# Patient Record
Sex: Male | Born: 1995 | Race: Black or African American | Hispanic: No | Marital: Single | Smoking: Former smoker
Health system: Southern US, Community
[De-identification: ages and names within clinical notes are randomized; demographics above are authoritative.]

## PROBLEM LIST (undated history)

## (undated) DIAGNOSIS — T7840XA Allergy, unspecified, initial encounter: Secondary | ICD-10-CM

## (undated) DIAGNOSIS — E669 Obesity, unspecified: Secondary | ICD-10-CM

## (undated) HISTORY — DX: Obesity, unspecified: E66.9

## (undated) HISTORY — DX: Allergy, unspecified, initial encounter: T78.40XA

## (undated) HISTORY — PX: WISDOM TOOTH EXTRACTION: SHX21

---
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
## 2016-02-08 ENCOUNTER — Ambulatory Visit: Payer: Medicaid Other | Admitting: Family Medicine

## 2016-09-25 ENCOUNTER — Emergency Department (HOSPITAL_COMMUNITY)
Admission: EM | Admit: 2016-09-25 | Discharge: 2016-09-25 | Disposition: A | Discharge status: Home / Self Care | Payer: Medicaid Other | Attending: Emergency Medicine | Admitting: Emergency Medicine

## 2016-09-25 ENCOUNTER — Encounter (HOSPITAL_COMMUNITY): Payer: Self-pay | Admitting: *Deleted

## 2016-09-25 DIAGNOSIS — L02212 Cutaneous abscess of back [any part, except buttock]: Secondary | ICD-10-CM | POA: Insufficient documentation

## 2016-09-25 DIAGNOSIS — Z79899 Other long term (current) drug therapy: Secondary | ICD-10-CM | POA: Insufficient documentation

## 2016-09-25 NOTE — Discharge Instructions (Signed)
You can take Aleve for pain Keep wound clean with warm soap and water and keep bandage dry, do not submerge in water for 24 hours. Change bandage sooner if it gets dirty Return for fever, increased redness, swelling, pain, or worsening drainage

## 2016-09-25 NOTE — ED Notes (Signed)
See EDP secondary assessment.  

## 2016-09-25 NOTE — ED Triage Notes (Signed)
Pt c/o abscess to mid, upper back with pus and blood drainage.

## 2016-09-25 NOTE — ED Provider Notes (Signed)
[facility] DEPT Provider Note   CSN: [id number] Arrival date & time: 09/25/16  2053  By signing my name below, I, Micheal Hall, attest that this documentation has been prepared under the direction and in the presence of Janetta Hora, PA-C. Electronically Signed: Dora Hall, Scribe. 09/25/2016. 9:57 PM.  History   Chief Complaint Chief Complaint  Patient presents with  . Abscess   The history is provided by the patient. No language interpreter was used.    HPI Comments: Micheal Hall is a 21 y.o. male who presents to the Emergency Department complaining of a moderate, gradually worsening area of pain and swelling to his upper back for 5 days. The pain is worse with palpation. He states the area has been draining spontaneously with transient improvement. No medications or treatments tried. Patient has no prior h/o the same. He denies fevers, chills, or any other associated symptoms.  Past Medical History:  Diagnosis Date  . Allergy   . Obesity     Patient Active Problem List   Diagnosis Date Noted  . Atypical chest pain 06/18/2014  . Low back pain 06/18/2014  . Bilateral lower abdominal discomfort 04/18/2014  . Morbid obesity ([place]) 02/19/2014  . Acanthosis nigricans 02/19/2014  . High blood pressure 02/19/2014  . Allergy     Past Surgical History:  Procedure Laterality Date  . WISDOM TOOTH EXTRACTION         Home Medications    Prior to Admission medications   Medication Sig Start Date End Date Taking? Authorizing Provider  polyethylene glycol (MIRALAX / GLYCOLAX) packet Take 17 g by mouth daily. Until stooling regularly 06/18/14   Hilton Sinclair, MD  simethicone (MYLICON) 80 MG chewable tablet Chew 1 tablet (80 mg total) by mouth every 6 (six) hours as needed for flatulence. 06/18/14   Hilton Sinclair, MD    Family History Family History  Problem Relation Age of Onset  . Cancer Mother        lymphoma; in remission  . Hypertension Mother   .  Hypertension Maternal Aunt   . Cancer Maternal Aunt   . Hypertension Maternal Uncle   . Cancer Maternal Uncle   . Cancer Maternal Grandmother   . Hypertension Maternal Grandmother   . Hypertension Maternal Grandfather   . Thyroid disease Maternal Aunt     Social History Social History  Substance Use Topics  . Smoking status: Never Smoker  . Smokeless tobacco: Never Used  . Alcohol use No     Allergies   Patient has no known allergies.   Review of Systems Review of Systems  Constitutional: Negative for chills and fever.  Skin: Positive for wound.   Physical Exam Updated Vital Signs BP (!) 161/86 (BP Location: Left Arm)   Pulse 98   Temp 98.6 F (37 C) (Oral)   Resp 16   Ht 5\' 9"  (1.753 m)   Wt 293 lb (132.9 kg)   SpO2 94%   BMI 43.27 kg/m   Physical Exam  Constitutional: He is oriented to person, place, and time. He appears well-developed and well-nourished. No distress.  HENT:  Head: Normocephalic and atraumatic.  Eyes: Conjunctivae and EOM are normal.  Neck: Neck supple. No tracheal deviation present.  Cardiovascular: Normal rate.   Pulmonary/Chest: Effort normal. No respiratory distress.  Musculoskeletal: Normal range of motion.  Neurological: He is alert and oriented to person, place, and time.  Skin: Skin is warm and dry.  Small area of purulent drainage over right  upper back with surrounding induration. No fluctuance and minimal tenderness  Psychiatric: He has a normal mood and affect. His behavior is normal.  Nursing note and vitals reviewed.  ED Treatments / Results  Labs (all labs ordered are listed, but only abnormal results are displayed) Labs Reviewed - No data to display  EKG  EKG Interpretation None       Radiology No results found.  Procedures .Marland KitchenIncision and Drainage Date/Time: 09/25/2016 10:26 PM Performed by: Janetta Hora MARIE Authorized by: Janetta Hora MARIE   Consent:    Consent obtained:  Verbal   Consent given by:   Patient   Risks discussed:  Bleeding and incomplete drainage Universal protocol:    Procedure explained and questions answered to patient or proxy's satisfaction: yes     Relevant documents present and verified: yes   Location:    Type:  Abscess   Location:  Trunk   Trunk location:  Back Pre-procedure details:    Skin preparation:  Betadine Anesthesia (see MAR for exact dosages):    Anesthesia method:  Local infiltration   Local anesthetic:  Lidocaine 2% w/o epi Procedure type:    Complexity:  Simple Procedure details:    Incision types:  Single straight   Incision depth:  Dermal   Wound management:  Probed and deloculated   Drainage:  Bloody and purulent   Drainage amount:  Scant   Wound treatment:  Wound left open   Packing materials:  None Post-procedure details:    Patient tolerance of procedure:  Tolerated well, no immediate complications    (including critical care time)  DIAGNOSTIC STUDIES: Oxygen Saturation is 94% on RA, adequate by my interpretation.    COORDINATION OF CARE: 9:57 PM Discussed treatment plan with pt at bedside and pt agreed to plan.  Medications Ordered in ED Medications - No data to display   Initial Impression / Assessment and Plan / ED Course  I have reviewed the triage vital signs and the nursing notes.  Pertinent labs & imaging results that were available during my care of the patient were reviewed by me and considered in my medical decision making (see chart for details).  Patient with skin abscess vs cyst. Incision and drainage performed in the ED today.  Abscess was not large enough to warrant packing or drain placement. I&D was somewhat complicated by the patient because he kept getting lightheaded during procedure and had to lie down several times. After the 3rd attempt, I decided to stop procedure. There was minimal drainage expressed from wound. Return precautions were given.  Final Clinical Impressions(s) / ED Diagnoses   Final  diagnoses:  Abscess of back    New Prescriptions New Prescriptions   No medications on file   I personally performed the services described in this documentation, which was scribed in my presence. The recorded information has been reviewed and is accurate.    [name], PA-C 09/26/16 Delton Coombes, MD 09/28/16 [phone]

## 2016-09-27 ENCOUNTER — Emergency Department (HOSPITAL_COMMUNITY)
Admission: EM | Admit: 2016-09-27 | Discharge: 2016-09-27 | Disposition: A | Discharge status: Home / Self Care | Payer: Self-pay | Attending: Emergency Medicine | Admitting: Emergency Medicine

## 2016-09-27 ENCOUNTER — Encounter (HOSPITAL_COMMUNITY): Payer: Self-pay | Admitting: Emergency Medicine

## 2016-09-27 DIAGNOSIS — Z5189 Encounter for other specified aftercare: Secondary | ICD-10-CM

## 2016-09-27 DIAGNOSIS — L02212 Cutaneous abscess of back [any part, except buttock]: Secondary | ICD-10-CM | POA: Insufficient documentation

## 2016-09-27 DIAGNOSIS — Z79899 Other long term (current) drug therapy: Secondary | ICD-10-CM | POA: Insufficient documentation

## 2016-09-27 MED ORDER — CLINDAMYCIN HCL 150 MG PO CAPS: 300.0000 mg | ORAL_CAPSULE | Freq: Three times a day (TID) | ORAL | 0 refills | Status: AC

## 2016-09-27 MED ORDER — CEPHALEXIN 500 MG PO CAPS: 500.0000 mg | ORAL_CAPSULE | Freq: Three times a day (TID) | ORAL | 0 refills | Status: AC

## 2016-09-27 MED ORDER — LIDOCAINE-EPINEPHRINE (PF) 2 %-1:200000 IJ SOLN
10.0000 mL | Freq: Once | INTRAMUSCULAR | Status: AC
  Administered 2016-09-27: 10 mL
  Filled 2016-09-27: qty 20

## 2016-09-27 NOTE — ED Provider Notes (Signed)
[facility] DEPT Provider Note   By signing my name below, I, Bea Graff, attest that this documentation has been prepared under the direction and in the presence of Martinique Russo, PA-C. Electronically Signed: Bea Graff, ED Scribe. 09/27/16. 3:22 PM.    History   Chief Complaint Chief Complaint  Patient presents with  . Follow-up     The history is provided by the patient and medical records. No language interpreter was used.    Micheal Hall is an obese 21 y.o. male who presents to the Emergency Department needing a wound checked after an incision and drainage of an abscess to the upper back two days ago. He reports associated increased drainage. He reports manipulating the area to express drainage. He has not taken anything for pain. There are no modifying factors noted. He denies fever, chills, nausea, vomiting, increased pain or redness. He does not have a PCP.    Past Medical History:  Diagnosis Date  . Allergy   . Obesity     Patient Active Problem List   Diagnosis Date Noted  . Atypical chest pain 06/18/2014  . Low back pain 06/18/2014  . Bilateral lower abdominal discomfort 04/18/2014  . Morbid obesity ([place]) 02/19/2014  . Acanthosis nigricans 02/19/2014  . High blood pressure 02/19/2014  . Allergy     Past Surgical History:  Procedure Laterality Date  . WISDOM TOOTH EXTRACTION         Home Medications    Prior to Admission medications   Medication Sig Start Date End Date Taking? Authorizing Provider  cephALEXin (KEFLEX) 500 MG capsule Take 1 capsule (500 mg total) by mouth 3 (three) times daily. 09/27/16 10/04/16  Russo, Martinique N, PA-C  clindamycin (CLEOCIN) 150 MG capsule Take 2 capsules (300 mg total) by mouth 3 (three) times daily. 09/27/16 10/04/16  Russo, Martinique N, PA-C  polyethylene glycol (MIRALAX / GLYCOLAX) packet Take 17 g by mouth daily. Until stooling regularly 06/18/14   Hilton Sinclair, MD  simethicone (MYLICON) 80 MG  chewable tablet Chew 1 tablet (80 mg total) by mouth every 6 (six) hours as needed for flatulence. 06/18/14   Hilton Sinclair, MD    Family History Family History  Problem Relation Age of Onset  . Cancer Mother        lymphoma; in remission  . Hypertension Mother   . Hypertension Maternal Aunt   . Cancer Maternal Aunt   . Hypertension Maternal Uncle   . Cancer Maternal Uncle   . Cancer Maternal Grandmother   . Hypertension Maternal Grandmother   . Hypertension Maternal Grandfather   . Thyroid disease Maternal Aunt     Social History Social History  Substance Use Topics  . Smoking status: Never Smoker  . Smokeless tobacco: Never Used  . Alcohol use No     Allergies   Patient has no known allergies.   Review of Systems Review of Systems  Constitutional: Negative for chills and fever.  Gastrointestinal: Negative for nausea and vomiting.  Skin: Positive for wound.     Physical Exam Updated Vital Signs BP (!) 142/92 (BP Location: Right Arm)   Pulse 67   Temp 98.3 F (36.8 C) (Oral)   Resp 16   Ht 5\' 9"  (1.753 m)   Wt 295 lb (133.8 kg)   SpO2 98%   BMI 43.56 kg/m   Physical Exam  Constitutional: He appears well-developed and well-nourished. No distress.  HENT:  Head: Normocephalic and atraumatic.  Eyes: Conjunctivae are normal.  Cardiovascular: Normal rate and intact distal pulses.   Pulmonary/Chest: Effort normal.  Skin: Skin is warm and dry.  Right upper back with draining incision. Incision is <1cm in length, and healing. Drainage is purulent and bloody. Copious amount expressed with pressure. Surrounding area of induration superior to incision. No surrounding erythema.  Psychiatric: He has a normal mood and affect. His behavior is normal.  Nursing note and vitals reviewed.    ED Treatments / Results  DIAGNOSTIC STUDIES: Oxygen Saturation is 98% on RA, normal by my interpretation.   COORDINATION OF CARE: 12:58 PM- Will perform bedside  ultrasound. Pt verbalizes understanding and agrees to plan.  Medications  lidocaine-EPINEPHrine (XYLOCAINE W/EPI) 2 %-1:200000 (PF) injection 10 mL (10 mLs Infiltration Given 09/27/16 1500)    Labs (all labs ordered are listed, but only abnormal results are displayed) Labs Reviewed - No data to display  EKG  EKG Interpretation None       Radiology No results found.  Procedures EMERGENCY DEPARTMENT US SOFT TISSUE INTERPRETATION "Study: Limited Soft Tissue Ultrasound"  INDICATIONS: Soft tissue infection Multiple views of the body part were obtained in real-time with a multi-frequency linear probe. U/A performed after manipulation and expression of purulent fluid.  PERFORMED BY: Myself IMAGES ARCHIVED?: Yes SIDE:Right  BODY PART:Upper back INTERPRETATION:  No abcess noted and No cellulitis noted    .Marland KitchenIncision and Drainage Date/Time: 09/27/2016 2:40 PM Performed by: RUSSO, Martinique N Authorized by: RUSSO, Martinique N   Consent:    Consent obtained:  Verbal   Consent given by:  Patient   Risks discussed:  Bleeding, pain and infection   Alternatives discussed:  No treatment and observation Location:    Type:  Abscess   Location:  Trunk   Trunk location:  Back Pre-procedure details:    Skin preparation:  Chloraprep Anesthesia (see MAR for exact dosages):    Anesthesia method:  Local infiltration   Local anesthetic:  Lidocaine 2% WITH epi Procedure type:    Complexity:  Simple Procedure details:    Needle aspiration: no     Incision types:  Cruciate   Incision depth:  Dermal   Scalpel blade:  11   Wound management:  Irrigated with saline   Drainage amount:  Copious   Wound treatment:  Wound left open Post-procedure details:    Patient tolerance of procedure:  Tolerated well, no immediate complications Comments:     Wound incision expanded from previous I&D with cruciate incision.   (including critical care time)  Medications Ordered in ED Medications    lidocaine-EPINEPHrine (XYLOCAINE W/EPI) 2 %-1:200000 (PF) injection 10 mL (10 mLs Infiltration Given 09/27/16 1500)     Initial Impression / Assessment and Plan / ED Course  I have reviewed the triage vital signs and the nursing notes.  Pertinent labs & imaging results that were available during my care of the patient were reviewed by me and considered in my medical decision making (see chart for details).     Pt with abscess seen two days ago with I&D. Chart reviewed in EPIC. Wound with small incision from previous I&D, actively draining moderate amount of purulent fluid daily. On exam, copious purulent fluid able to be expressed with pressure. I expanded incision with cruciate incision, irrigated with saline and left open. No signs of surrounding cellulitis noted on U/S.  Pt with minimal pain and tenderness in the area. Will start on Clindamycin and Keflex for coverage. Discussed warm compresses and soaks. Discussed strict return precautions. Pt advised to  continue all antibiotics until they are gone. Pt is afebrile, nontoxic, safe for discharge with wound recheck in 2 days.  Patient discussed with Arlean Hopping, PA-C.  Discussed results, findings, treatment and follow up. Patient advised of return precautions. Patient verbalized understanding and agreed with plan.     Final Clinical Impressions(s) / ED Diagnoses   Final diagnoses:  Wound check, abscess    New Prescriptions New Prescriptions   CEPHALEXIN (KEFLEX) 500 MG CAPSULE    Take 1 capsule (500 mg total) by mouth 3 (three) times daily.   CLINDAMYCIN (CLEOCIN) 150 MG CAPSULE    Take 2 capsules (300 mg total) by mouth 3 (three) times daily.   I personally performed the services described in this documentation, which was scribed in my presence. The recorded information has been reviewed and is accurate.      Russo, Martinique N, PA-C 09/27/16 [place], MD 09/27/16 [phone]

## 2016-09-27 NOTE — ED Triage Notes (Signed)
Pt states he had a cyst drained here two days ago. Drainage has not decreased over the last two days. States it is white and bloody drainage.

## 2016-09-27 NOTE — ED Notes (Signed)
See edp assessment 

## 2016-09-27 NOTE — Discharge Instructions (Signed)
Please read instructions below.  Keep your wound clean and covered. Apply warm compresses and do warm water soaks multiple times per day. Take the antibiotics, as prescribed, until gone. You can take advil every 6 hours as needed for pain. Follow up with your primary care or urgent care for wound recheck in 2 days.  Return to the ER for fever, increasing redness, or new or worsening symptoms.

## 2016-09-27 NOTE — ED Notes (Signed)
Pt's incision covered with Non-adherent, covered with 4X4's & then taped down.

## 2016-09-27 NOTE — ED Notes (Signed)
Incision and Drainage Kit at pt's bedside.

## 2016-09-27 NOTE — ED Notes (Signed)
Pt. Called this Rn and left a message for a return call. Pt. Verbalized concern for the wound and increased drainage.   Explained to pt. That I am unable to assess the abscess and if he feels that it is having increased drainage, I advised him to have it checked by his PCP or come back to the ED.  He verbalized understanding.

## 2019-10-31 ENCOUNTER — Emergency Department (HOSPITAL_COMMUNITY): Payer: HRSA Program

## 2019-10-31 ENCOUNTER — Other Ambulatory Visit (HOSPITAL_COMMUNITY): Payer: Self-pay | Admitting: Oncology

## 2019-10-31 ENCOUNTER — Encounter (HOSPITAL_COMMUNITY): Payer: Self-pay | Admitting: Emergency Medicine

## 2019-10-31 ENCOUNTER — Emergency Department (HOSPITAL_COMMUNITY)
Admission: EM | Admit: 2019-10-31 | Discharge: 2019-10-31 | Disposition: A | Discharge status: Home / Self Care | Payer: HRSA Program | Attending: Emergency Medicine | Admitting: Emergency Medicine

## 2019-10-31 ENCOUNTER — Other Ambulatory Visit: Payer: Self-pay

## 2019-10-31 DIAGNOSIS — U071 COVID-19: Secondary | ICD-10-CM

## 2019-10-31 DIAGNOSIS — E669 Obesity, unspecified: Secondary | ICD-10-CM | POA: Diagnosis not present

## 2019-10-31 DIAGNOSIS — R000 Tachycardia, unspecified: Secondary | ICD-10-CM | POA: Insufficient documentation

## 2019-10-31 DIAGNOSIS — I10 Essential (primary) hypertension: Secondary | ICD-10-CM | POA: Insufficient documentation

## 2019-10-31 DIAGNOSIS — R55 Syncope and collapse: Secondary | ICD-10-CM | POA: Diagnosis present

## 2019-10-31 LAB — CBC WITH DIFFERENTIAL/PLATELET
Abs Immature Granulocytes: 0.03 10*3/uL (ref 0.00–0.07)
Basophils Absolute: 0 10*3/uL (ref 0.0–0.1)
Basophils Relative: 0 %
Eosinophils Absolute: 0 10*3/uL (ref 0.0–0.5)
Eosinophils Relative: 0 %
HCT: 43.3 % (ref 39.0–52.0)
Hemoglobin: 14.3 g/dL (ref 13.0–17.0)
Immature Granulocytes: 0 %
Lymphocytes Relative: 18 %
Lymphs Abs: 1.3 10*3/uL (ref 0.7–4.0)
MCH: 28.1 pg (ref 26.0–34.0)
MCHC: 33 g/dL (ref 30.0–36.0)
MCV: 85.1 fL (ref 80.0–100.0)
Monocytes Absolute: 0.5 10*3/uL (ref 0.1–1.0)
Monocytes Relative: 7 %
Neutro Abs: 5.1 10*3/uL (ref 1.7–7.7)
Neutrophils Relative %: 75 %
Platelets: 153 10*3/uL (ref 150–400)
RBC: 5.09 MIL/uL (ref 4.22–5.81)
RDW: 12.7 % (ref 11.5–15.5)
WBC: 6.9 10*3/uL (ref 4.0–10.5)
nRBC: 0 % (ref 0.0–0.2)

## 2019-10-31 LAB — HEPATIC FUNCTION PANEL
ALT: 21 U/L (ref 0–44)
AST: 33 U/L (ref 15–41)
Albumin: 3.7 g/dL (ref 3.5–5.0)
Alkaline Phosphatase: 72 U/L (ref 38–126)
Bilirubin, Direct: 0.3 mg/dL — ABNORMAL HIGH (ref 0.0–0.2)
Indirect Bilirubin: 1 mg/dL — ABNORMAL HIGH (ref 0.3–0.9)
Total Bilirubin: 1.3 mg/dL — ABNORMAL HIGH (ref 0.3–1.2)
Total Protein: 7.7 g/dL (ref 6.5–8.1)

## 2019-10-31 LAB — BASIC METABOLIC PANEL
Anion gap: 12 (ref 5–15)
BUN: 8 mg/dL (ref 6–20)
CO2: 27 mmol/L (ref 22–32)
Calcium: 8.9 mg/dL (ref 8.9–10.3)
Chloride: 93 mmol/L — ABNORMAL LOW (ref 98–111)
Creatinine, Ser: 1.2 mg/dL (ref 0.61–1.24)
GFR calc Af Amer: >60 mL/min (ref >60)
GFR calc non Af Amer: >60 mL/min (ref >60)
Glucose, Bld: 112 mg/dL — ABNORMAL HIGH (ref 70–99)
Potassium: 3.8 mmol/L (ref 3.5–5.1)
Sodium: 132 mmol/L — ABNORMAL LOW (ref 135–145)

## 2019-10-31 LAB — CBG MONITORING, ED: Glucose-Capillary: 116 mg/dL — ABNORMAL HIGH (ref 70–99)

## 2019-10-31 LAB — TROPONIN I (HIGH SENSITIVITY)
Troponin I (High Sensitivity): 6 ng/L (ref <18)
Troponin I (High Sensitivity): 5 ng/L (ref <18)

## 2019-10-31 LAB — LIPASE, BLOOD: Lipase: 32 U/L (ref 11–51)

## 2019-10-31 MED ORDER — SODIUM CHLORIDE 0.9 % IV BOLUS (SEPSIS)
1000.0000 mL | Freq: Once | INTRAVENOUS | Status: AC
  Administered 2019-10-31: 1000 mL via INTRAVENOUS

## 2019-10-31 MED ORDER — PREDNISONE 20 MG PO TABS
40.0000 mg | ORAL_TABLET | Freq: Once | ORAL | Status: AC
  Administered 2019-10-31: 40 mg via ORAL
  Filled 2019-10-31: qty 2

## 2019-10-31 MED ORDER — PREDNISONE 10 MG PO TABS: 40.0000 mg | ORAL_TABLET | Freq: Every day | ORAL | 0 refills | Status: AC

## 2019-10-31 MED ORDER — ALBUTEROL SULFATE HFA 108 (90 BASE) MCG/ACT IN AERS
4.0000 | INHALATION_SPRAY | Freq: Once | RESPIRATORY_TRACT | Status: AC
  Administered 2019-10-31: 4 via RESPIRATORY_TRACT
  Filled 2019-10-31: qty 6.7

## 2019-10-31 MED ORDER — AEROCHAMBER PLUS FLO-VU MISC
1.0000 | Freq: Once | Status: DC
  Filled 2019-10-31: qty 1

## 2019-10-31 MED ORDER — BENZONATATE 100 MG PO CAPS: 100.0000 mg | ORAL_CAPSULE | Freq: Three times a day (TID) | ORAL | 0 refills | Status: DC

## 2019-10-31 MED ORDER — IOHEXOL 350 MG/ML SOLN
70.0000 mL | Freq: Once | INTRAVENOUS | Status: AC | PRN
  Administered 2019-10-31: 70 mL via INTRAVENOUS

## 2019-10-31 MED ORDER — SODIUM CHLORIDE 0.9 % IV SOLN
1000.0000 mL | INTRAVENOUS | Status: DC
  Administered 2019-10-31: 1000 mL via INTRAVENOUS

## 2019-10-31 NOTE — ED Provider Notes (Signed)
[facility] EMERGENCY DEPARTMENT Provider Note   CSN: [id number] Arrival date & time: 10/31/19  1093     History Chief Complaint  Patient presents with  . Loss of Consciousness    Micheal Hall is a 24 y.o. male history of obesity, hypertension. No prior daily medication use.  Patient reports Covid positive 9 days ago, he was symptom free when test was taken, he too the test after his girlfriend tested positive for Covid. He began having symptoms 2 days later. He reports he is currently on day 7 of symptoms, describes nasal congestion, body aches, cough, nonbloody diarrhea. Symptoms have been mild but worsened over the last 1 day. He reports cough is now productive with clear sputum. He reports he has now developed chest pain which is a sharp sensation in the center of his chest worse with coughing and deep breathing improved with rest, pain is now minimal. He called EMS today and while on their stretcher had a brief episode of synocpe, this was associated with increased shortness of breath. He has not been taking any medications at home for his symptoms.  Associated symptom of decreased appetite.  Denies head injury, blood thinner use, neck pain, back pain, abdominal pain, extremity swelling/color change, history of blood clot, hemoptysis or additional concerns.  HPI     Past Medical History:  Diagnosis Date  . Allergy   . Obesity     Patient Active Problem List   Diagnosis Date Noted  . Atypical chest pain 06/18/2014  . Low back pain 06/18/2014  . Bilateral lower abdominal discomfort 04/18/2014  . Morbid obesity ([place]) 02/19/2014  . Acanthosis nigricans 02/19/2014  . High blood pressure 02/19/2014  . Allergy     Past Surgical History:  Procedure Laterality Date  . WISDOM TOOTH EXTRACTION         Family History  Problem Relation Age of Onset  . Cancer Mother        lymphoma; in remission  . Hypertension Mother   . Hypertension Maternal Aunt   .  Cancer Maternal Aunt   . Hypertension Maternal Uncle   . Cancer Maternal Uncle   . Cancer Maternal Grandmother   . Hypertension Maternal Grandmother   . Hypertension Maternal Grandfather   . Thyroid disease Maternal Aunt     Social History   Tobacco Use  . Smoking status: Never Smoker  . Smokeless tobacco: Never Used  Substance Use Topics  . Alcohol use: No    Alcohol/week: 0.0 standard drinks  . Drug use: No    Home Medications Prior to Admission medications   Medication Sig Start Date End Date Taking? Authorizing Provider  benzonatate (TESSALON) 100 MG capsule Take 1 capsule (100 mg total) by mouth every 8 (eight) hours. 10/31/19   Nuala Alpha A, PA-C  polyethylene glycol (MIRALAX / GLYCOLAX) packet Take 17 g by mouth daily. Until stooling regularly 06/18/14   Hilton Sinclair, MD  predniSONE (DELTASONE) 10 MG tablet Take 4 tablets (40 mg total) by mouth daily for 4 days. 11/01/19 11/05/19  Nuala Alpha A, PA-C  simethicone (MYLICON) 80 MG chewable tablet Chew 1 tablet (80 mg total) by mouth every 6 (six) hours as needed for flatulence. 06/18/14   Hilton Sinclair, MD    Allergies    Patient has no known allergies.  Review of Systems   Review of Systems Ten systems are reviewed and are negative for acute change except as noted in the HPI  Physical  Exam Updated Vital Signs BP 124/81   Pulse 75   Temp 99.6 F (37.6 C) (Oral)   Resp (!) 24   SpO2 98%   Physical Exam Constitutional:      General: He is not in acute distress.    Appearance: Normal appearance. He is well-developed. He is obese. He is not ill-appearing or diaphoretic.  HENT:     Head: Normocephalic and atraumatic.  Eyes:     General: Vision grossly intact. Gaze aligned appropriately.     Pupils: Pupils are equal, round, and reactive to light.  Neck:     Trachea: Trachea and phonation normal.  Cardiovascular:     Rate and Rhythm: Regular rhythm. Tachycardia present.     Pulses: Normal  pulses.  Pulmonary:     Effort: Pulmonary effort is normal. No respiratory distress.     Breath sounds: Normal breath sounds.  Abdominal:     General: There is no distension.     Palpations: Abdomen is soft.     Tenderness: There is no abdominal tenderness. There is no guarding or rebound.  Musculoskeletal:        General: Normal range of motion.     Cervical back: Normal range of motion.     Right lower leg: No edema.     Left lower leg: No edema.  Skin:    General: Skin is warm and dry.  Neurological:     Mental Status: He is alert.     GCS: GCS eye subscore is 4. GCS verbal subscore is 5. GCS motor subscore is 6.     Comments: Speech is clear and goal oriented, follows commands Major Cranial nerves without deficit, no facial droop Moves extremities without ataxia, coordination intact  Psychiatric:        Behavior: Behavior normal.     ED Results / Procedures / Treatments   Labs (all labs ordered are listed, but only abnormal results are displayed) Labs Reviewed  BASIC METABOLIC PANEL - Abnormal; Notable for the following components:      Result Value   Sodium 132 (*)    Chloride 93 (*)    Glucose, Bld 112 (*)    All other components within normal limits  HEPATIC FUNCTION PANEL - Abnormal; Notable for the following components:   Total Bilirubin 1.3 (*)    Bilirubin, Direct 0.3 (*)    Indirect Bilirubin 1.0 (*)    All other components within normal limits  CBG MONITORING, ED - Abnormal; Notable for the following components:   Glucose-Capillary 116 (*)    All other components within normal limits  CBC WITH DIFFERENTIAL/PLATELET  LIPASE, BLOOD  TROPONIN I (HIGH SENSITIVITY)  TROPONIN I (HIGH SENSITIVITY)    EKG EKG Interpretation  Date/Time:  Thursday October 31 2019 01:34:47 EDT Ventricular Rate:  73 PR Interval:    QRS Duration: 110 QT Interval:  353 QTC Calculation: 389 R Axis:   49 Text Interpretation: Sinus rhythm NO STEMI. No old tracing to compare  Confirmed by Addison Lank [id number] on 10/31/2019 6:24:49 AM   Radiology CT Angio Chest PE W and/or Wo Contrast  Result Date: 10/31/2019 CLINICAL DATA:  24 year old male with concern for pulmonary embolism. EXAM: CT ANGIOGRAPHY CHEST WITH CONTRAST TECHNIQUE: Multidetector CT imaging of the chest was performed using the standard protocol during bolus administration of intravenous contrast. Multiplanar CT image reconstructions and MIPs were obtained to evaluate the vascular anatomy. CONTRAST:  57mL OMNIPAQUE IOHEXOL 350 MG/ML SOLN COMPARISON:  Chest radiograph dated  10/31/2019. FINDINGS: Cardiovascular: There is no cardiomegaly or pericardial effusion. The thoracic aorta is unremarkable. The origins of the great vessels of the aortic arch appear patent. Evaluation of the pulmonary arteries is limited due to respiratory motion artifact and suboptimal opacification of the peripheral branches. No central pulmonary artery embolus identified. Mediastinum/Nodes: Mildly enlarged right hilar lymph node measures 14 mm. The esophagus is grossly unremarkable. No mediastinal fluid collection. Lungs/Pleura: Bilateral confluent airspace opacities consistent with multifocal pneumonia, likely viral or atypical in etiology including COVID-19. Clinical correlation is recommended. No pleural effusion pneumothorax. The central airways are patent. Upper Abdomen: No acute abnormality. Musculoskeletal: No chest wall abnormality. No acute or significant osseous findings. Review of the MIP images confirms the above findings. IMPRESSION: 1. No CT evidence of central pulmonary artery embolus. 2. Multifocal pneumonia in keeping with COVID-19. Clinical correlation is recommended. 3. Mildly enlarged right hilar lymph node, likely reactive. Electronically Signed   By: Anner Crete M.D.   On: 10/31/2019 03:24   DG Chest Port 1 View  Result Date: 10/31/2019 CLINICAL DATA:  24 year old male with shortness of breath. Positive COVID-19.  EXAM: PORTABLE CHEST 1 VIEW COMPARISON:  None. FINDINGS: Faint bilateral pulmonary densities, left greater right most concerning for developing infiltrate, likely viral or atypical in etiology and in keeping with COVID-19. Clinical correlation is recommended. No pleural effusion pneumothorax. The cardiac silhouette is within limits. No acute osseous pathology. IMPRESSION: Findings most concerning for developing infiltrate. Electronically Signed   By: Anner Crete M.D.   On: 10/31/2019 01:25    Procedures .Critical Care Performed by: Deliah Boston, PA-C Authorized by: Deliah Boston, PA-C   Critical care provider statement:    Critical care time (minutes):  31   Critical care was necessary to treat or prevent imminent or life-threatening deterioration of the following conditions: hypotension requiring multiple liters of fluid.   Critical care was time spent personally by me on the following activities:  Discussions with consultants, evaluation of patient's response to treatment, examination of patient, ordering and performing treatments and interventions, ordering and review of laboratory studies, ordering and review of radiographic studies, pulse oximetry, re-evaluation of patient's condition, obtaining history from patient or surrogate, review of old charts and development of treatment plan with patient or surrogate   (including critical care time)  Medications Ordered in ED Medications  sodium chloride 0.9 % bolus 1,000 mL (0 mLs Intravenous Stopped 10/31/19 0246)    Followed by  sodium chloride 0.9 % bolus 1,000 mL (0 mLs Intravenous Stopped 10/31/19 0433)    Followed by  0.9 %  sodium chloride infusion (0 mLs Intravenous Stopped 10/31/19 0619)  aerochamber plus with mask device 1 each (has no administration in time range)  iohexol (OMNIPAQUE) 350 MG/ML injection 70 mL (70 mLs Intravenous Contrast Given 10/31/19 0312)  predniSONE (DELTASONE) tablet 40 mg (40 mg Oral Given 10/31/19  0614)  albuterol (VENTOLIN HFA) 108 (90 Base) MCG/ACT inhaler 4 puff (4 puffs Inhalation Given 10/31/19 8921)    ED Course  I have reviewed the triage vital signs and the nursing notes.  Pertinent labs & imaging results that were available during my care of the patient were reviewed by me and considered in my medical decision making (see chart for details).    MDM Rules/Calculators/A&P                          Additional history obtained from: 1. Nursing notes from this visit.  2. EMS personnel.  [facility] COVID-19 Test Result +    ------------- 24 year old male Covid positive on seventh the symptoms presented for shortness of breath, syncope and pleuritic chest pain. Suspect patient symptoms secondary to COVID-19 viral infection. Labs ordered, given syncope and pleuritic chest pain CT angio PE study has been ordered as well. Currently patient stable no acute distress. - 1:50 AM: Informed by nursing staff patient hypotensive. On exam patient slightly diaphoretic reports nausea and lightheadedness, he is sitting upright in bed. Patient was laid back, he reports the sensation is improving. Patient seen and evaluated by Dr. Leonette Monarch, 2 L IV fluid ordered. I manually auscultated patient's blood pressure 100/60. Question vagal. - I ordered, reviewed and interpreted labs which include: Initial and delta high-sensitivity troponin within normal limits without interim elevation. Lipase within normal limits. LFTs without emergent elevations. BMP shows no emergent electrolyte derangement, AKI or gap. CBC within normal limits no leukocytosis or anemia. CBG 116.  Chest x-ray   IMPRESSION:  Findings most concerning for developing infiltrate.   CT Angio PE Study:  IMPRESSION:  1. No CT evidence of central pulmonary artery embolus.  2. Multifocal pneumonia in keeping with COVID-19. Clinical  correlation is recommended.  3. Mildly enlarged right hilar lymph node, likely reactive.   EKG:  Sinus rhythm NO STEMI. No old tracing to compare Confirmed by Addison Lank [phone]) on 10/31/2019 6:24:49 AM - Work-up today consistent with COVID-19 viral infection.  Suspect earlier episodes of syncope and hypotension secondary to vasovagal possible dehydration.  Patient reassessed multiple times well-appearing no acute distress and is requesting discharge.  He has not had any hypoxia or tachycardia throughout his visit.  Patient was ambulated on room air without desaturation or shortness of breath.  Orthostatics were negative after fluids.  Plan of care is to treat patient with prednisone burst, patient without history of diabetes.  Additionally will give patient albuterol inhaler and spacer.  I have sent patient information to the monoclonal antibody team to schedule patient an appointment, he should qualify based on his BMI.  Additionally will prescribe Tessalon for cough, no indication for antibiotics at this time.  At this time there does not appear to be any evidence of an acute emergency medical condition and the patient appears stable for discharge with appropriate outpatient follow up. Diagnosis was discussed with patient who verbalizes understanding of care plan and is agreeable to discharge. I have discussed return precautions with patient who verbalizes understanding. Patient encouraged to follow-up with their PCP. All questions answered.  Patient's case discussed with Dr. Leonette Monarch who agrees with plan to discharge with follow-up.   Micheal Hall was evaluated in Emergency Department on 10/31/2019 for the symptoms described in the history of present illness. He was evaluated in the context of the global COVID-19 pandemic, which necessitated consideration that the patient might be at risk for infection with the SARS-CoV-2 virus that causes COVID-19. Institutional protocols and algorithms that pertain to the evaluation of patients at risk for COVID-19 are in a state of rapid change based on  information released by regulatory bodies including the CDC and federal and state organizations. These policies and algorithms were followed during the patient's care in the ED.  Note: Portions of this report may have been transcribed using voice recognition software. Every effort was made to ensure accuracy; however, inadvertent computerized transcription errors may still be present. Final Clinical Impression(s) / ED Diagnoses Final diagnoses:  COVID-19 virus infection    Rx / DC Orders  ED Discharge Orders         Ordered    predniSONE (DELTASONE) 10 MG tablet  Daily        10/31/19 0622    benzonatate (TESSALON) 100 MG capsule  Every 8 hours        10/31/19 0622           Gari Crown 10/31/19 2527    Fatima Blank, MD 11/01/19 [phone]

## 2019-10-31 NOTE — ED Notes (Signed)
Orthostatic VS  Supine :127/74 (87)   HR 80, 99 RA, RR18  Sitting: 109/71 (77)  HR 91, 96 RA, RR 20  Standing : 115/75 (87) HR 87, 97 RA, RR24

## 2019-10-31 NOTE — ED Triage Notes (Signed)
Per EMS, pt from home, was diagnosed w COVID last Tuesday.  Symptoms include weakness, fever, SOB, loss of taste/smell and headache.  Once in the squad, he had a 30 second syncopal episode.    120/70 HR 70 CBG 122 RR 18

## 2019-10-31 NOTE — Discharge Instructions (Addendum)
At this time there does not appear to be the presence of an emergent medical condition, however there is always the potential for conditions to change. Please read and follow the below instructions.  Please return to the Emergency Department immediately for any new or worsening symptoms. Please be sure to follow up with your Primary Care Provider within one week regarding your visit today; please call their office to schedule an appointment even if you are feeling better for a follow-up visit. Please take the medication prednisone as prescribed to help with your symptoms.  You were given your first dose in the ER today, you may begin taking her next dose of 40 mg starting tomorrow morning.  You may use the albuterol inhaler as prescribed to help with shortness of breath.  If you feel your shortness of breath is not improved with albuterol and you are having a hard time breathing please return immediately to the emergency department for evaluation.  You may use the medication Tessalon as prescribed to help with cough.  Please use over-the-counter anti-inflammatory such as Tylenol as directed on the packaging to help with fever and body aches.  Please drink plenty water and get plenty of rest.  You may buy a pulse oximeter to use at home to monitor your oxygenation level, if it goes below 90% call 911 and return immediately to the ER. You were referred to the monoclonal antibody treatment center.  They should call your phone today or tomorrow to schedule you an appointment for infusion if you qualify.  Get help right away if: You have trouble breathing. You have pain or pressure in your chest. You have confusion. You have bluish lips and fingernails. You have difficulty waking from sleep. You have any new/concerning or worsening of symptoms These symptoms may represent a serious problem that is an emergency. Do not wait to see if the symptoms will go away. Get medical help right away. Call your local  emergency services (911 in the [place] Do not drive yourself to the hospital. Let the emergency medical personnel know if you think you have COVID-19.  Please read the additional information packets attached to your discharge summary.  Do not take your medicine if  develop an itchy rash, swelling in your mouth or lips, or difficulty breathing; call 911 and seek immediate emergency medical attention if this occurs.  You may review your lab tests and imaging results in their entirety on your MyChart account.  Please discuss all results of fully with your primary care provider and other specialist at your follow-up visit.  Note: Portions of this text may have been transcribed using voice recognition software. Every effort was made to ensure accuracy; however, inadvertent computerized transcription errors may still be present.

## 2019-10-31 NOTE — Progress Notes (Signed)
Called to Discuss with patient about Covid symptoms and the use of regeneron, a monoclonal antibody infusion for those with mild to moderate Covid symptoms and at a high risk of hospitalization.     Pt is qualified for this infusion at the [facility] due to co-morbid conditions and/or a member of an at-risk group.     Unable to reach pt. Left message to return call  Jenny Ruthetta Koopmann , AGNP-C [phone] (Infusion Center Hotline)  

## 2019-10-31 NOTE — ED Notes (Signed)
Pt ambulated in the room with steady gait, O2 remained between 95-98% while ambulating. Pt did not complain of any SOB, only that he had some body aches while ambulating.

## 2019-10-31 NOTE — ED Notes (Signed)
MD made aware of BP ,MD to room to see pt

## 2019-11-01 NOTE — ED Provider Notes (Signed)
Attestation: Medical screening examination/treatment/procedure(s) were conducted as a shared visit with non-physician practitioner(s) and myself.  I personally evaluated the patient during the encounter.   Briefly, the patient is a 24 y.o. male with h/o known Covid infection, here for syncopal episode at home.   Vitals:   10/31/19 0545 10/31/19 0600  BP: 114/81 124/81  Pulse: 77 75  Resp:    Temp:    SpO2: 96% 98%    CONSTITUTIONAL: Nontoxic-appearing, NAD NEURO:  Alert and oriented x 3, no focal deficits EYES:  pupils equal and reactive ENT/NECK:  trachea midline, no JVD CARDIO: Tacky rate, regular rhythm, well-perfused PULM: Mildly labored breathing GI/GU:  Abdomen non-distended MSK/SPINE:  No gross deformities, no edema SKIN:  no rash, atraumatic PSYCH:  Appropriate speech and behavior   EKG Interpretation  Date/Time:  Thursday October 31 2019 01:34:47 EDT Ventricular Rate:  73 PR Interval:    QRS Duration: 110 QT Interval:  353 QTC Calculation: 389 R Axis:   49 Text Interpretation: Sinus rhythm NO STEMI. No old tracing to compare Confirmed by Addison Lank [phone]) on 10/31/2019 6:24:49 AM Also confirmed by Addison Lank [phone]), editor Hattie Perch ([id number])  on 10/31/2019 11:56:33 AM       Patient initially hypotensive with systolics in the 50P requiring IV fluids.  Work-up negative for PE.  No anemia.  No significant electrolyte derangements or renal sufficiency.  Just to make sure she does not  After 2 L of IV fluids, able tolerate oral intake.    .Critical Care Performed by: Fatima Blank, MD Authorized by: Fatima Blank, MD    CRITICAL CARE Performed by: Grayce Sessions Maddelynn Moosman Total critical care time: 15 minutes Critical care time was exclusive of separately billable procedures and treating other patients. Critical care was necessary to treat or prevent imminent or life-threatening deterioration. Critical care was time spent personally  by me on the following activities: development of treatment plan with patient and/or surrogate as well as nursing, discussions with consultants, evaluation of patient's response to treatment, examination of patient, obtaining history from patient or surrogate, ordering and performing treatments and interventions, ordering and review of laboratory studies, ordering and review of radiographic studies, pulse oximetry and re-evaluation of patient's condition.        Fatima Blank, MD 11/01/19 [id number]

## 2019-11-05 ENCOUNTER — Inpatient Hospital Stay (HOSPITAL_COMMUNITY)
Admission: EM | Admit: 2019-11-05 | Discharge: 2019-11-09 | DRG: 871 | Disposition: A | Discharge status: Home / Self Care | Payer: PRIVATE HEALTH INSURANCE | Attending: Internal Medicine | Admitting: Internal Medicine

## 2019-11-05 ENCOUNTER — Inpatient Hospital Stay (HOSPITAL_COMMUNITY): Payer: PRIVATE HEALTH INSURANCE

## 2019-11-05 ENCOUNTER — Encounter (HOSPITAL_COMMUNITY): Payer: Self-pay | Admitting: Pharmacy Technician

## 2019-11-05 ENCOUNTER — Other Ambulatory Visit: Payer: Self-pay

## 2019-11-05 ENCOUNTER — Emergency Department (HOSPITAL_COMMUNITY): Payer: PRIVATE HEALTH INSURANCE

## 2019-11-05 DIAGNOSIS — R6520 Severe sepsis without septic shock: Secondary | ICD-10-CM

## 2019-11-05 DIAGNOSIS — Z8249 Family history of ischemic heart disease and other diseases of the circulatory system: Secondary | ICD-10-CM

## 2019-11-05 DIAGNOSIS — E871 Hypo-osmolality and hyponatremia: Secondary | ICD-10-CM | POA: Diagnosis present

## 2019-11-05 DIAGNOSIS — Z807 Family history of other malignant neoplasms of lymphoid, hematopoietic and related tissues: Secondary | ICD-10-CM

## 2019-11-05 DIAGNOSIS — A419 Sepsis, unspecified organism: Secondary | ICD-10-CM | POA: Diagnosis present

## 2019-11-05 DIAGNOSIS — Z87891 Personal history of nicotine dependence: Secondary | ICD-10-CM

## 2019-11-05 DIAGNOSIS — J1282 Pneumonia due to coronavirus disease 2019: Secondary | ICD-10-CM | POA: Diagnosis present

## 2019-11-05 DIAGNOSIS — Z6841 Body Mass Index (BMI) 40.0 and over, adult: Secondary | ICD-10-CM

## 2019-11-05 DIAGNOSIS — D473 Essential (hemorrhagic) thrombocythemia: Secondary | ICD-10-CM | POA: Diagnosis present

## 2019-11-05 DIAGNOSIS — J9601 Acute respiratory failure with hypoxia: Secondary | ICD-10-CM | POA: Diagnosis present

## 2019-11-05 DIAGNOSIS — T380X5A Adverse effect of glucocorticoids and synthetic analogues, initial encounter: Secondary | ICD-10-CM | POA: Diagnosis not present

## 2019-11-05 DIAGNOSIS — U071 COVID-19: Secondary | ICD-10-CM | POA: Diagnosis present

## 2019-11-05 DIAGNOSIS — A4189 Other specified sepsis: Principal | ICD-10-CM | POA: Diagnosis present

## 2019-11-05 DIAGNOSIS — D649 Anemia, unspecified: Secondary | ICD-10-CM | POA: Diagnosis present

## 2019-11-05 LAB — COMPREHENSIVE METABOLIC PANEL
ALT: 15 U/L (ref 0–44)
AST: 23 U/L (ref 15–41)
Albumin: 3 g/dL — ABNORMAL LOW (ref 3.5–5.0)
Alkaline Phosphatase: 53 U/L (ref 38–126)
Anion gap: 10 (ref 5–15)
BUN: 8 mg/dL (ref 6–20)
CO2: 27 mmol/L (ref 22–32)
Calcium: 8.8 mg/dL — ABNORMAL LOW (ref 8.9–10.3)
Chloride: 97 mmol/L — ABNORMAL LOW (ref 98–111)
Creatinine, Ser: 0.86 mg/dL (ref 0.61–1.24)
GFR calc Af Amer: >60 mL/min (ref >60)
GFR calc non Af Amer: >60 mL/min (ref >60)
Glucose, Bld: 105 mg/dL — ABNORMAL HIGH (ref 70–99)
Potassium: 3.7 mmol/L (ref 3.5–5.1)
Sodium: 134 mmol/L — ABNORMAL LOW (ref 135–145)
Total Bilirubin: 1.2 mg/dL (ref 0.3–1.2)
Total Protein: 7.3 g/dL (ref 6.5–8.1)

## 2019-11-05 LAB — C-REACTIVE PROTEIN: CRP: 13.8 mg/dL — ABNORMAL HIGH (ref <1.0)

## 2019-11-05 LAB — CBC WITH DIFFERENTIAL/PLATELET
Abs Immature Granulocytes: 0.4 10*3/uL — ABNORMAL HIGH (ref 0.00–0.07)
Basophils Absolute: 0.1 10*3/uL (ref 0.0–0.1)
Basophils Relative: 1 %
Eosinophils Absolute: 0.1 10*3/uL (ref 0.0–0.5)
Eosinophils Relative: 1 %
HCT: 39.9 % (ref 39.0–52.0)
Hemoglobin: 13.3 g/dL (ref 13.0–17.0)
Immature Granulocytes: 3 %
Lymphocytes Relative: 14 %
Lymphs Abs: 1.7 10*3/uL (ref 0.7–4.0)
MCH: 28.3 pg (ref 26.0–34.0)
MCHC: 33.3 g/dL (ref 30.0–36.0)
MCV: 84.9 fL (ref 80.0–100.0)
Monocytes Absolute: 1.4 10*3/uL — ABNORMAL HIGH (ref 0.1–1.0)
Monocytes Relative: 12 %
Neutro Abs: 8.6 10*3/uL — ABNORMAL HIGH (ref 1.7–7.7)
Neutrophils Relative %: 69 %
Platelets: 494 10*3/uL — ABNORMAL HIGH (ref 150–400)
RBC: 4.7 MIL/uL (ref 4.22–5.81)
RDW: 12.8 % (ref 11.5–15.5)
WBC: 12.2 10*3/uL — ABNORMAL HIGH (ref 4.0–10.5)
nRBC: 1.3 % — ABNORMAL HIGH (ref 0.0–0.2)

## 2019-11-05 LAB — TRIGLYCERIDES: Triglycerides: 123 mg/dL (ref <150)

## 2019-11-05 LAB — D-DIMER, QUANTITATIVE: D-Dimer, Quant: 2.36 ug/mL-FEU — ABNORMAL HIGH (ref 0.00–0.50)

## 2019-11-05 LAB — ABO/RH: ABO/RH(D): O POS

## 2019-11-05 LAB — STREP PNEUMONIAE URINARY ANTIGEN: Strep Pneumo Urinary Antigen: NEGATIVE

## 2019-11-05 LAB — HIV ANTIBODY (ROUTINE TESTING W REFLEX): HIV Screen 4th Generation wRfx: NONREACTIVE

## 2019-11-05 LAB — LACTIC ACID, PLASMA: Lactic Acid, Venous: 1.3 mmol/L (ref 0.5–1.9)

## 2019-11-05 LAB — FERRITIN: Ferritin: 606 ng/mL — ABNORMAL HIGH (ref 24–336)

## 2019-11-05 LAB — PROCALCITONIN: Procalcitonin: <0.1 ng/mL

## 2019-11-05 LAB — FIBRINOGEN: Fibrinogen: >800 mg/dL — ABNORMAL HIGH (ref 210–475)

## 2019-11-05 LAB — LACTATE DEHYDROGENASE: LDH: 437 U/L — ABNORMAL HIGH (ref 98–192)

## 2019-11-05 MED ORDER — ACETAMINOPHEN 325 MG PO TABS: 650.0000 mg | ORAL_TABLET | Freq: Four times a day (QID) | ORAL | Status: DC | PRN

## 2019-11-05 MED ORDER — SODIUM CHLORIDE 0.9 % IV SOLN
100.0000 mg | Freq: Every day | INTRAVENOUS | Status: AC
  Administered 2019-11-06 – 2019-11-09 (×4): 100 mg via INTRAVENOUS
  Filled 2019-11-05: qty 20
  Filled 2019-11-05: qty 100
  Filled 2019-11-05 (×2): qty 20

## 2019-11-05 MED ORDER — SODIUM CHLORIDE 0.9% FLUSH
3.0000 mL | Freq: Two times a day (BID) | INTRAVENOUS | Status: DC
  Administered 2019-11-05 – 2019-11-09 (×9): 3 mL via INTRAVENOUS

## 2019-11-05 MED ORDER — ZINC SULFATE 220 (50 ZN) MG PO CAPS
220.0000 mg | ORAL_CAPSULE | Freq: Every day | ORAL | Status: DC
  Administered 2019-11-05 – 2019-11-09 (×5): 220 mg via ORAL
  Filled 2019-11-05 (×5): qty 1

## 2019-11-05 MED ORDER — IOHEXOL 350 MG/ML SOLN
75.0000 mL | Freq: Once | INTRAVENOUS | Status: AC | PRN
  Administered 2019-11-05: 75 mL via INTRAVENOUS

## 2019-11-05 MED ORDER — HYDROCOD POLST-CPM POLST ER 10-8 MG/5ML PO SUER: 5.0000 mL | Freq: Two times a day (BID) | ORAL | Status: DC | PRN

## 2019-11-05 MED ORDER — SODIUM CHLORIDE 0.9 % IV SOLN
200.0000 mg | Freq: Once | INTRAVENOUS | Status: AC
  Administered 2019-11-05: 200 mg via INTRAVENOUS
  Filled 2019-11-05: qty 40

## 2019-11-05 MED ORDER — FAMOTIDINE 20 MG PO TABS
20.0000 mg | ORAL_TABLET | Freq: Two times a day (BID) | ORAL | Status: DC
  Administered 2019-11-05 – 2019-11-09 (×8): 20 mg via ORAL
  Filled 2019-11-05 (×8): qty 1

## 2019-11-05 MED ORDER — BARICITINIB 2 MG PO TABS
4.0000 mg | ORAL_TABLET | Freq: Every day | ORAL | Status: DC
  Administered 2019-11-05 – 2019-11-09 (×5): 4 mg via ORAL
  Filled 2019-11-05 (×5): qty 2

## 2019-11-05 MED ORDER — ALBUTEROL SULFATE HFA 108 (90 BASE) MCG/ACT IN AERS
2.0000 | INHALATION_SPRAY | Freq: Four times a day (QID) | RESPIRATORY_TRACT | Status: DC
  Administered 2019-11-05 – 2019-11-09 (×14): 2 via RESPIRATORY_TRACT
  Filled 2019-11-05: qty 6.7

## 2019-11-05 MED ORDER — ALBUTEROL SULFATE HFA 108 (90 BASE) MCG/ACT IN AERS
4.0000 | INHALATION_SPRAY | Freq: Once | RESPIRATORY_TRACT | Status: DC
  Filled 2019-11-05: qty 6.7

## 2019-11-05 MED ORDER — METHYLPREDNISOLONE SODIUM SUCC 40 MG IJ SOLR: 0.2500 mg/kg | Freq: Two times a day (BID) | INTRAMUSCULAR | Status: DC

## 2019-11-05 MED ORDER — GUAIFENESIN-DM 100-10 MG/5ML PO SYRP: 10.0000 mL | ORAL_SOLUTION | ORAL | Status: DC | PRN

## 2019-11-05 MED ORDER — ONDANSETRON HCL 4 MG PO TABS: 4.0000 mg | ORAL_TABLET | Freq: Four times a day (QID) | ORAL | Status: DC | PRN

## 2019-11-05 MED ORDER — METHYLPREDNISOLONE SODIUM SUCC 40 MG IJ SOLR
0.2500 mg/kg | Freq: Two times a day (BID) | INTRAMUSCULAR | Status: DC
  Administered 2019-11-05: 33.2 mg via INTRAVENOUS
  Filled 2019-11-05: qty 1

## 2019-11-05 MED ORDER — ENOXAPARIN SODIUM 40 MG/0.4ML ~~LOC~~ SOLN
40.0000 mg | SUBCUTANEOUS | Status: DC
  Administered 2019-11-05 – 2019-11-06 (×2): 40 mg via SUBCUTANEOUS
  Filled 2019-11-05 (×2): qty 0.4

## 2019-11-05 MED ORDER — ONDANSETRON HCL 4 MG/2ML IJ SOLN: 4.0000 mg | Freq: Four times a day (QID) | INTRAMUSCULAR | Status: DC | PRN

## 2019-11-05 MED ORDER — ASCORBIC ACID 500 MG PO TABS
500.0000 mg | ORAL_TABLET | Freq: Every day | ORAL | Status: DC
  Administered 2019-11-05 – 2019-11-09 (×5): 500 mg via ORAL
  Filled 2019-11-05 (×5): qty 1

## 2019-11-05 NOTE — H&P (Signed)
History and Physical    DEMETRIE BORGE STM:[id number] DOB: 11/07/1995 DOA: 11/05/2019  Referring MD/NP/PA: Quintella Reichert PCP: Patient, No Pcp Per  Patient coming from: Home via EMS  Chief Complaint: Shortness of breath and weakness  I have personally briefly reviewed patient's old medical records in [place]   HPI: IFEOLUWA BELLER is a 24 y.o. male with medical history significant of morbid obesity presents with complaints of shortness of breath and weakness over the last 2 weeks.  He reports symptoms initially started off with a mild cough and congestion he tested positive for COVID-19 on 8/18 at [facility] (a screenshot of results are visible on Nuala Alpha, PA-C note from 8/20).  He did not receive any COVID-19 vaccinations.  Noted associated symptoms of intermittent fevers, generalized malaise, myalgias, diarrhea, change in smell/taste, and poor appetite.  Denies having any vomiting, leg swelling, calf pain, dysuria, change in vision, or focal weakness.  He had been seen in the emergency department 5 days ago for symptoms.  Reported to have had a syncopal episode with EMS, CT angiogram of the chest did not show any signs of pulmonary embolus.  Patient ultimately was able to be discharged home with a prednisone and albuterol inhaler.  Since being home patient reported that he was generally weak and was not getting up and moving around a lot.  He did take the medications as prescribed without any improvement.  His breathing seemed to be getting worse and he complained of chest pain when trying to take a inspiratory breath.  Therefore called EMS today.  O2 saturations noted to be in 50s on scene and improved on nonrebreather at 15L to 90%.  Patient had been given 125 mg of Solu-Medrol 2 g of magnesium sulfate, and 0.3 mg of epinephrine.    ED Course: Upon admission into the emergency department patient was noted to be afebrile, pulse 96-107, respirations 14- 34, and O2 saturation 93-98%  currently on high flow nasal cannula oxygen.  Labs significant for WBC 12.2, platelets 494, LDH 437, lactic acid 1.3, D-dimer 2.36, fibrinogen> 800.  Chest x-ray showed new diffuse interstitial opacities concerning for Covid pneumonia.  Patient was given albuterol inhaler.  TRH called to admit.  Review of Systems  Constitutional: Positive for malaise/fatigue.  HENT: Positive for congestion. Negative for ear discharge.   Eyes: Negative for photophobia and pain.  Respiratory: Positive for cough and shortness of breath.   Cardiovascular: Positive for chest pain. Negative for leg swelling.  Gastrointestinal: Positive for diarrhea. Negative for abdominal pain, nausea and vomiting.  Genitourinary: Negative for dysuria and hematuria.  Musculoskeletal: Positive for myalgias. Negative for falls.  Skin: Negative for rash.  Neurological: Positive for weakness. Negative for focal weakness and seizures.  Endo/Heme/Allergies: Negative for polydipsia. Does not bruise/bleed easily.  Psychiatric/Behavioral: Negative for memory loss and substance abuse.    Past Medical History:  Diagnosis Date  . Allergy   . Obesity     Past Surgical History:  Procedure Laterality Date  . WISDOM TOOTH EXTRACTION       reports that he has never smoked. He has never used smokeless tobacco. He reports that he does not drink alcohol and does not use drugs.  No Known Allergies  Family History  Problem Relation Age of Onset  . Cancer Mother        lymphoma; in remission  . Hypertension Mother   . Hypertension Maternal Aunt   . Cancer Maternal Aunt   . Hypertension Maternal  Uncle   . Cancer Maternal Uncle   . Cancer Maternal Grandmother   . Hypertension Maternal Grandmother   . Hypertension Maternal Grandfather   . Thyroid disease Maternal Aunt     Prior to Admission medications   Medication Sig Start Date End Date Taking? Authorizing Provider  albuterol (VENTOLIN HFA) 108 (90 Base) MCG/ACT inhaler Inhale 2  puffs into the lungs every 4 (four) hours as needed for wheezing or shortness of breath.   Yes [provider]  benzonatate (TESSALON) 100 MG capsule Take 1 capsule (100 mg total) by mouth every 8 (eight) hours. 10/31/19  Yes Nuala Alpha A, PA-C  Phenylephrine-DM-GG-APAP (MUCINEX FAST-MAX COLD FLU PO) Take 10 mLs by mouth 2 (two) times daily as needed (cough and cold).    Yes [provider]  predniSONE (DELTASONE) 10 MG tablet Take 4 tablets (40 mg total) by mouth daily for 4 days. Patient not taking: Reported on 11/05/2019 11/01/19 11/05/19  Deliah Boston, PA-C    Physical Exam:  Constitutional: Young male who appears to be acutely ill  Vitals:   11/05/19 1100 11/05/19 1115 11/05/19 1130 11/05/19 1145  BP: (!) 161/91 (!) 150/86 (!) 147/92 138/80  Pulse: 100 97 (!) 107 (!) 102  Resp: (!) 31 14 (!) 34 (!) 25  Temp:      TempSrc:      SpO2: 93% 98% 97% 94%  Weight:      Height:       Eyes: PERRL, lids and conjunctivae normal ENMT: Mucous membranes are moist. Posterior pharynx clear of any exudate or lesions.  Neck: normal, supple, no masses, no thyromegaly Respiratory: Tachypneic currently on nonrebreather mask with O2 saturations around 92 to 93%.  O2 saturations were intermittently dipped into the upper 80s when patient attempts to talk. Cardiovascular: Regular rate and rhythm, no murmurs / rubs / gallops. No extremity edema. 2+ pedal pulses. No carotid bruits.  Abdomen: no tenderness, no masses palpated. No hepatosplenomegaly. Bowel sounds positive.  Musculoskeletal: no clubbing / cyanosis. No joint deformity upper and lower extremities. Good ROM, no contractures. Normal muscle tone.  Skin: no rashes, lesions, ulcers. No induration Neurologic: CN 2-12 grossly intact. Sensation intact, DTR normal. Strength 5/5 in all 4.  Psychiatric: Normal judgment and insight. Alert and oriented x 3. Normal mood.     Labs on Admission: I have personally reviewed following  labs and imaging studies  CBC: Recent Labs  Lab 10/31/19 0137 11/05/19 1045  WBC 6.9 12.2*  NEUTROABS 5.1 8.6*  HGB 14.3 13.3  HCT 43.3 39.9  MCV 85.1 84.9  PLT 153 607*   Basic Metabolic Panel: Recent Labs  Lab 10/31/19 0137 11/05/19 1045  NA 132* 134*  K 3.8 3.7  CL 93* 97*  CO2 27 27  GLUCOSE 112* 105*  BUN 8 8  CREATININE 1.20 0.86  CALCIUM 8.9 8.8*   GFR: Estimated Creatinine Clearance: 180.6 mL/min (by C-G formula based on SCr of 0.86 mg/dL). Liver Function Tests: Recent Labs  Lab 10/31/19 0137 11/05/19 1045  AST 33 23  ALT 21 15  ALKPHOS 72 53  BILITOT 1.3* 1.2  PROT 7.7 7.3  ALBUMIN 3.7 3.0*   Recent Labs  Lab 10/31/19 0137  LIPASE 32   No results for input(s): AMMONIA in the last 168 hours. Coagulation Profile: No results for input(s): INR, PROTIME in the last 168 hours. Cardiac Enzymes: No results for input(s): CKTOTAL, CKMB, CKMBINDEX, TROPONINI in the last 168 hours. BNP (last 3 results) No  results for input(s): PROBNP in the last 8760 hours. HbA1C: No results for input(s): HGBA1C in the last 72 hours. CBG: Recent Labs  Lab 10/31/19 0124  GLUCAP 116*   Lipid Profile: Recent Labs    11/05/19 1045  TRIG 123   Thyroid Function Tests: No results for input(s): TSH, T4TOTAL, FREET4, T3FREE, THYROIDAB in the last 72 hours. Anemia Panel: No results for input(s): VITAMINB12, FOLATE, FERRITIN, TIBC, IRON, RETICCTPCT in the last 72 hours. Urine analysis: No results found for: COLORURINE, APPEARANCEUR, LABSPEC, PHURINE, GLUCOSEU, HGBUR, BILIRUBINUR, KETONESUR, PROTEINUR, UROBILINOGEN, NITRITE, LEUKOCYTESUR Sepsis Labs: No results found for this or any previous visit (from the past 240 hour(s)).   Radiological Exams on Admission: DG Chest Port 1 View  Result Date: 11/05/2019 CLINICAL DATA:  Respiratory distress, COVID EXAM: PORTABLE CHEST 1 VIEW COMPARISON:  10/31/2019 FINDINGS: The heart size and mediastinal contours are within normal  limits. New diffuse bilateral heterogeneous airspace opacity. The visualized skeletal structures are unremarkable. IMPRESSION: New diffuse bilateral heterogeneous airspace opacity, consistent with COVID airspace disease. Electronically Signed   By: Eddie Candle M.D.   On: 11/05/2019 11:08    EKG: Independently reviewed.  Sinus rhythm 97 bpm  Assessment/Plan Acute respiratory failure with hypoxia secondary to pneumonia due to COVID-19: Patient had initially been diagnosed with COVID-19 on 8/18.  At home he had not been doing well with poor appetite worsening shortness of breath.  O2 saturation noted to be as low as 50% upon EMS arrival.  Currently on nonrebreather.  Chest x-ray showing new diffuse bilateral heterogeneous airspace opacities.  Labs significant for CRP 13.8, D-dimer 2.36, fibrinogen greater than 800, ferritin 606, and LDH 437.  -Admit to a progressive bed -COVID-19 order set utilized -Continuous pulse oximetry with oxygen to maintain O2 saturation greater than 90% -Check CT angiogram of the chest given elevated D-dimer of 2.36 -Remdesivir per pharmacy day 1 of 5 -Solu-Medrol IV twice daily day 10 -baricitinib day 1 of 14 -Albuterol inhaler every 6 hours -Antitussives as needed -Vitamin C and zinc -Follow-up pending inflammatory markers and continue to monitor daily  Sepsis: Acute.  On admission patient was found to be tachycardic and tachypneic with WBC elevated at 12.2.  Lactic acid was relatively reassuring at 1.3.  COVID-19 likely source of infection. Procalcitonin <0.1 make sense bacterial infection less likely. -Follow-up blood and sputum cultures   Morbid obesity: BMI 43.3 kg/m.  DVT prophylaxis: lovenox   Code Status: full  Family Communication: Patient's mother updated over the phone Disposition Plan: Hopefully discharge home in 3 to 5 days Consults called: None Admission status: inpatient   Norval Morton MD [facility] Pager [phone]   If  7PM-7AM, please contact night-coverage [email or web address] Password [facility]  11/05/2019, 12:18 PM

## 2019-11-05 NOTE — ED Notes (Signed)
Patient has been placed on 6L [place] and 10L NRB

## 2019-11-05 NOTE — ED Triage Notes (Signed)
Pt bib ems with shob worsening over the last few days. Dx with Covid on 8/18. Pt with decreased po intake. Has been taking abx as prescribed. Used albuterol pta without relief. Pt oxygen saturation in the 50's on scene. Placed on NRB at [place] with improvement to 90%. Other VSS with ems. Pt in NAD. Given 125mg  solumedrol, 2g magnesium and 0.3mg  IM epi.

## 2019-11-05 NOTE — ED Notes (Signed)
Patient transported to CT 

## 2019-11-05 NOTE — ED Notes (Signed)
Got patient undress on the monitor did ekg shown to Dr Ralene Bathe patient is resting with call bell in reach

## 2019-11-05 NOTE — ED Notes (Signed)
Admitting MD at the bedside.  

## 2019-11-05 NOTE — ED Provider Notes (Signed)
[facility] EMERGENCY DEPARTMENT Provider Note   CSN: [id number] Arrival date & time: 11/05/19  1039     History Chief Complaint  Patient presents with  . Shortness of Breath    Micheal Hall is a 24 y.o. male.  The history is provided by the patient, medical records and the EMS personnel. No language interpreter was used.   Micheal Hall is a 24 y.o. male who presents to the Emergency Department complaining of shortness of breath, COVID-19 infection. He presents the emergency department by EMS for evaluation of chest pain, difficulty breathing. On August 18 he began feeling poorly with cough, congestion and body aches and tested positive for COVID-19 infection. He states that initially his symptoms were mild and later he developed fevers. Over the last few days he has experienced severe chest pain with dyspnea on exertion. He states that he is unable to perform any activities due to difficulty breathing. He called 911 today due to his trouble breathing. On EMS arrival he was found to have oxygen saturations in the 50s. He was placed on on rebreather and treated with Solu-Medrol, epinephrine. He states that his breathing does feel somewhat improved on ED arrival.    Past Medical History:  Diagnosis Date  . Allergy   . Obesity     Patient Active Problem List   Diagnosis Date Noted  . Acute respiratory failure with hypoxia ([place]) 11/05/2019  . Sepsis ([place]) 11/05/2019  . Pneumonia due to COVID-19 virus 11/05/2019  . Atypical chest pain 06/18/2014  . Low back pain 06/18/2014  . Bilateral lower abdominal discomfort 04/18/2014  . Morbid obesity ([place]) 02/19/2014  . Acanthosis nigricans 02/19/2014  . High blood pressure 02/19/2014  . Allergy     Past Surgical History:  Procedure Laterality Date  . WISDOM TOOTH EXTRACTION         Family History  Problem Relation Age of Onset  . Cancer Mother        lymphoma; in remission  . Hypertension Mother   .  Hypertension Maternal Aunt   . Cancer Maternal Aunt   . Hypertension Maternal Uncle   . Cancer Maternal Uncle   . Cancer Maternal Grandmother   . Hypertension Maternal Grandmother   . Hypertension Maternal Grandfather   . Thyroid disease Maternal Aunt   . Hypertension Father   . Heart disease Father     Social History   Tobacco Use  . Smoking status: Former Research scientist (life sciences)  . Smokeless tobacco: Never Used  Substance Use Topics  . Alcohol use: No    Alcohol/week: 0.0 standard drinks  . Drug use: No    Home Medications Prior to Admission medications   Medication Sig Start Date End Date Taking? Authorizing Provider  albuterol (VENTOLIN HFA) 108 (90 Base) MCG/ACT inhaler Inhale 2 puffs into the lungs every 4 (four) hours as needed for wheezing or shortness of breath.   Yes [provider]  benzonatate (TESSALON) 100 MG capsule Take 1 capsule (100 mg total) by mouth every 8 (eight) hours. 10/31/19  Yes Nuala Alpha A, PA-C  Phenylephrine-DM-GG-APAP (MUCINEX FAST-MAX COLD FLU PO) Take 10 mLs by mouth 2 (two) times daily as needed (cough and cold).    Yes [provider]  predniSONE (DELTASONE) 10 MG tablet Take 4 tablets (40 mg total) by mouth daily for 4 days. Patient not taking: Reported on 11/05/2019 11/01/19 11/05/19  Deliah Boston, PA-C    Allergies    Patient has no known  allergies.  Review of Systems   Review of Systems  All other systems reviewed and are negative.   Physical Exam Updated Vital Signs BP (!) 149/85   Pulse 83   Temp 99.4 F (37.4 C) (Oral)   Resp (!) 34   Ht 5\' 9"  (1.753 m)   Wt 133 kg   SpO2 93%   BMI 43.30 kg/m   Physical Exam Vitals and nursing note reviewed.  Constitutional:      General: He is in acute distress.     Appearance: He is well-developed. He is ill-appearing.  HENT:     Head: Normocephalic and atraumatic.  Cardiovascular:     Rate and Rhythm: Normal rate and regular rhythm.     Heart sounds: No murmur heard.    Pulmonary:     Effort: Pulmonary effort is normal. No respiratory distress.     Comments: Decreased air movement bilaterally, right greater than left. Abdominal:     Palpations: Abdomen is soft.     Tenderness: There is no abdominal tenderness. There is no guarding or rebound.  Musculoskeletal:        General: No tenderness.  Skin:    General: Skin is warm and dry.  Neurological:     Mental Status: He is alert and oriented to person, place, and time.  Psychiatric:        Behavior: Behavior normal.     ED Results / Procedures / Treatments   Labs (all labs ordered are listed, but only abnormal results are displayed) Labs Reviewed  CBC WITH DIFFERENTIAL/PLATELET - Abnormal; Notable for the following components:      Result Value   WBC 12.2 (*)    Platelets 494 (*)    nRBC 1.3 (*)    Neutro Abs 8.6 (*)    Monocytes Absolute 1.4 (*)    Abs Immature Granulocytes 0.40 (*)    All other components within normal limits  COMPREHENSIVE METABOLIC PANEL - Abnormal; Notable for the following components:   Sodium 134 (*)    Chloride 97 (*)    Glucose, Bld 105 (*)    Calcium 8.8 (*)    Albumin 3.0 (*)    All other components within normal limits  D-DIMER, QUANTITATIVE (NOT AT [facility]) - Abnormal; Notable for the following components:   D-Dimer, Quant 2.36 (*)    All other components within normal limits  LACTATE DEHYDROGENASE - Abnormal; Notable for the following components:   LDH 437 (*)    All other components within normal limits  FERRITIN - Abnormal; Notable for the following components:   Ferritin 606 (*)    All other components within normal limits  FIBRINOGEN - Abnormal; Notable for the following components:   Fibrinogen >800 (*)    All other components within normal limits  C-REACTIVE PROTEIN - Abnormal; Notable for the following components:   CRP 13.8 (*)    All other components within normal limits  CULTURE, BLOOD (ROUTINE X 2)  CULTURE, BLOOD (ROUTINE X 2)    EXPECTORATED SPUTUM ASSESSMENT W REFEX TO RESP CULTURE  LACTIC ACID, PLASMA  PROCALCITONIN  TRIGLYCERIDES  HIV ANTIBODY (ROUTINE TESTING W REFLEX)  LACTIC ACID, PLASMA  STREP PNEUMONIAE URINARY ANTIGEN  LEGIONELLA PNEUMOPHILA SEROGP 1 UR AG  ABO/RH    EKG EKG Interpretation  Date/Time:  Tuesday November 05 2019 10:44:22 EDT Ventricular Rate:  97 PR Interval:    QRS Duration: 100 QT Interval:  350 QTC Calculation: 445 R Axis:   59 Text Interpretation:  Sinus rhythm RSR' in V1 or V2, right VCD or RVH Confirmed by Quintella Reichert [id number]) on 11/05/2019 11:03:08 AM   Radiology DG Chest Port 1 View  Result Date: 11/05/2019 CLINICAL DATA:  Respiratory distress, COVID EXAM: PORTABLE CHEST 1 VIEW COMPARISON:  10/31/2019 FINDINGS: The heart size and mediastinal contours are within normal limits. New diffuse bilateral heterogeneous airspace opacity. The visualized skeletal structures are unremarkable. IMPRESSION: New diffuse bilateral heterogeneous airspace opacity, consistent with COVID airspace disease. Electronically Signed   By: Eddie Candle M.D.   On: 11/05/2019 11:08    Procedures Procedures (including critical care time) CRITICAL CARE Performed by: Quintella Reichert   Total critical care time: 32 minutes  Critical care time was exclusive of separately billable procedures and treating other patients.  Critical care was necessary to treat or prevent imminent or life-threatening deterioration.  Critical care was time spent personally by me on the following activities: development of treatment plan with patient and/or surrogate as well as nursing, discussions with consultants, evaluation of patient's response to treatment, examination of patient, obtaining history from patient or surrogate, ordering and performing treatments and interventions, ordering and review of laboratory studies, ordering and review of radiographic studies, pulse oximetry and re-evaluation of patient's  condition.  Medications Ordered in ED Medications  albuterol (VENTOLIN HFA) 108 (90 Base) MCG/ACT inhaler 4 puff (4 puffs Inhalation Not Given 11/05/19 1100)  enoxaparin (LOVENOX) injection 40 mg (40 mg Subcutaneous Given 11/05/19 1422)  sodium chloride flush (NS) 0.9 % injection 3 mL (3 mLs Intravenous Given 11/05/19 1302)  remdesivir 200 mg in sodium chloride 0.9% 250 mL IVPB (200 mg Intravenous New Bag/Given 11/05/19 1422)    Followed by  remdesivir 100 mg in sodium chloride 0.9 % 100 mL IVPB (has no administration in time range)  guaiFENesin-dextromethorphan (ROBITUSSIN DM) 100-10 MG/5ML syrup 10 mL (has no administration in time range)  chlorpheniramine-HYDROcodone (TUSSIONEX) 10-8 MG/5ML suspension 5 mL (has no administration in time range)  ascorbic acid (VITAMIN C) tablet 500 mg (500 mg Oral Given 11/05/19 1422)  zinc sulfate capsule 220 mg (220 mg Oral Given 11/05/19 1422)  famotidine (PEPCID) tablet 20 mg (has no administration in time range)  acetaminophen (TYLENOL) tablet 650 mg (has no administration in time range)  ondansetron (ZOFRAN) tablet 4 mg (has no administration in time range)    Or  ondansetron (ZOFRAN) injection 4 mg (has no administration in time range)  albuterol (VENTOLIN HFA) 108 (90 Base) MCG/ACT inhaler 2 puff (2 puffs Inhalation Given 11/05/19 1422)  methylPREDNISolone sodium succinate (SOLU-MEDROL) 40 mg/mL injection 33.2 mg (has no administration in time range)  baricitinib (OLUMIANT) tablet 4 mg (has no administration in time range)    ED Course  I have reviewed the triage vital signs and the nursing notes.  Pertinent labs & imaging results that were available during my care of the patient were reviewed by me and considered in my medical decision making (see chart for details).    MDM Rules/Calculators/A&P                          Patient with recent tag chest pain, shortness of breath. He has significant oxygen requirement on ED arrival, requiring 15 m  non-rebreather. Chest x-ray consistent with COVID-19 pneumonia. Given elevated D dimer and significant new hypoxia, pleuritic chest pain will obtain a CTA to rule out PE. Patient received steroids prior to ED arrival. Discussed with patient recommendation for admission and he is in  agreement with treatment plan. Hospitalist consulted for admission. Final Clinical Impression(s) / ED Diagnoses Final diagnoses:  Pneumonia due to COVID-19 virus  Acute respiratory failure with hypoxia [facility])    Rx / DC Orders ED Discharge Orders    None       Quintella Reichert, MD 11/05/19 1536

## 2019-11-06 ENCOUNTER — Encounter (HOSPITAL_COMMUNITY): Payer: Self-pay | Admitting: Internal Medicine

## 2019-11-06 LAB — COMPREHENSIVE METABOLIC PANEL
ALT: 16 U/L (ref 0–44)
AST: 21 U/L (ref 15–41)
Albumin: 2.8 g/dL — ABNORMAL LOW (ref 3.5–5.0)
Alkaline Phosphatase: 54 U/L (ref 38–126)
Anion gap: 10 (ref 5–15)
BUN: 12 mg/dL (ref 6–20)
CO2: 27 mmol/L (ref 22–32)
Calcium: 9 mg/dL (ref 8.9–10.3)
Chloride: 96 mmol/L — ABNORMAL LOW (ref 98–111)
Creatinine, Ser: 0.82 mg/dL (ref 0.61–1.24)
GFR calc Af Amer: >60 mL/min (ref >60)
GFR calc non Af Amer: >60 mL/min (ref >60)
Glucose, Bld: 111 mg/dL — ABNORMAL HIGH (ref 70–99)
Potassium: 4.6 mmol/L (ref 3.5–5.1)
Sodium: 133 mmol/L — ABNORMAL LOW (ref 135–145)
Total Bilirubin: 0.8 mg/dL (ref 0.3–1.2)
Total Protein: 7.3 g/dL (ref 6.5–8.1)

## 2019-11-06 LAB — CBC WITH DIFFERENTIAL/PLATELET
Abs Immature Granulocytes: 0.53 10*3/uL — ABNORMAL HIGH (ref 0.00–0.07)
Basophils Absolute: 0 10*3/uL (ref 0.0–0.1)
Basophils Relative: 0 %
Eosinophils Absolute: 0 10*3/uL (ref 0.0–0.5)
Eosinophils Relative: 0 %
HCT: 37.9 % — ABNORMAL LOW (ref 39.0–52.0)
Hemoglobin: 12.5 g/dL — ABNORMAL LOW (ref 13.0–17.0)
Immature Granulocytes: 5 %
Lymphocytes Relative: 14 %
Lymphs Abs: 1.7 10*3/uL (ref 0.7–4.0)
MCH: 28 pg (ref 26.0–34.0)
MCHC: 33 g/dL (ref 30.0–36.0)
MCV: 85 fL (ref 80.0–100.0)
Monocytes Absolute: 0.7 10*3/uL (ref 0.1–1.0)
Monocytes Relative: 6 %
Neutro Abs: 8.8 10*3/uL — ABNORMAL HIGH (ref 1.7–7.7)
Neutrophils Relative %: 75 %
Platelets: 481 10*3/uL — ABNORMAL HIGH (ref 150–400)
RBC: 4.46 MIL/uL (ref 4.22–5.81)
RDW: 13.1 % (ref 11.5–15.5)
WBC Morphology: >10
WBC: 11.8 10*3/uL — ABNORMAL HIGH (ref 4.0–10.5)
nRBC: 0.6 % — ABNORMAL HIGH (ref 0.0–0.2)

## 2019-11-06 LAB — PHOSPHORUS: Phosphorus: 3.8 mg/dL (ref 2.5–4.6)

## 2019-11-06 LAB — FERRITIN: Ferritin: 646 ng/mL — ABNORMAL HIGH (ref 24–336)

## 2019-11-06 LAB — D-DIMER, QUANTITATIVE: D-Dimer, Quant: 1.95 ug/mL-FEU — ABNORMAL HIGH (ref 0.00–0.50)

## 2019-11-06 LAB — MAGNESIUM: Magnesium: 2.6 mg/dL — ABNORMAL HIGH (ref 1.7–2.4)

## 2019-11-06 LAB — C-REACTIVE PROTEIN: CRP: 15.3 mg/dL — ABNORMAL HIGH (ref <1.0)

## 2019-11-06 MED ORDER — METHYLPREDNISOLONE SODIUM SUCC 125 MG IJ SOLR
80.0000 mg | Freq: Two times a day (BID) | INTRAMUSCULAR | Status: DC
  Administered 2019-11-06 – 2019-11-09 (×7): 80 mg via INTRAVENOUS
  Filled 2019-11-06 (×8): qty 2

## 2019-11-06 NOTE — ED Notes (Signed)
Pt transferred to hospital bed

## 2019-11-06 NOTE — Progress Notes (Addendum)
PROGRESS NOTE  Micheal Hall RWE:[id number] DOB: 28-Nov-1995 DOA: 11/05/2019  PCP: Patient, No Pcp Per  Brief History/Interval Summary: Male with obesity with no other significant past medical history presents with shortness of breath and weakness over the last 2 weeks.  He apparently tested positive for COVID-19 on 8/18.  The result was verified by the emergency department staff.  Patient underwent evaluation in the ED apparently he had a syncopal episode with EMS.  CT angiogram did not show any PE but did show evidence for interstitial opacities.  Patient was hospitalized for further management.  He was noted to be hypoxic as well.  Reason for Visit: Pneumonia due to COVID-19  Consultants: None  Procedures: None  Antibiotics: Anti-infectives (From admission, onward)   Start     Dose/Rate Route Frequency Ordered Stop   11/06/19 1000  remdesivir 100 mg in sodium chloride 0.9 % 100 mL IVPB       "Followed by" Linked Group Details   100 mg 200 mL/hr over 30 Minutes Intravenous Daily 11/05/19 1230 11/10/19 0959   11/05/19 1330  remdesivir 200 mg in sodium chloride 0.9% 250 mL IVPB       "Followed by" Linked Group Details   200 mg 580 mL/hr over 30 Minutes Intravenous Once 11/05/19 1230 11/05/19 1548      Subjective/Interval History: Patient states that he continues to have a cough with clear expectoration.  Denies any chest pain.  Still short of breath even at rest.  Some nausea but no vomiting.  No abdominal pain.    Assessment/Plan:  Acute Hypoxic Resp. Failure/Pneumonia due to COVID-19/sepsis, POA, resolved   Recent Labs  Lab 10/31/19 0137 11/05/19 1045 11/06/19 0442  DDIMER  --  2.36* 1.95*  FERRITIN  --  606* 646*  CRP  --  13.8* 15.3*  ALT 21 15 16   PROCALCITON  --  <0.10  --     Objective findings: Fever: Noted to be afebrile Oxygen requirements: Noted to be on nasal cannula 5 L/min along with a nonrebreather.  Nursing staff changed over to high flow nasal  cannula today.  Noted to be saturating in the late 80s to early 90s.  COVID 19 Therapeutics: Antibacterials: None.  Procalcitonin was less than 0.1 Remdesivir: Day 2 Steroids: Solu-Medrol.  Will increase dose due to rising CRP. Diuretics: Not given yet Actemra/Baricitinib: On baricitinib day 2 PUD Prophylaxis: On Pepcid DVT Prophylaxis:  Lovenox 40 mg once daily  From a respiratory standpoint patient seems to be stable though requiring a lot of oxygen currently.  Continue with Remdesivir steroids and baricitinib.  Inflammatory markers noted to be elevated with CRP of 15.3.  D-dimer 1.95.  CT scan done at the time of admission did not show any PE.  Incentive spirometry, mobilization, prone positioning.  Continue to trend inflammatory markers.  The treatment plan and use of medications and known side effects were discussed with patient. Some of the medications used are based on case reports/anecdotal data.  All other medications being used in the management of COVID-19 based on limited study data.  Complete risks and long-term side effects are unknown, however in the best clinical judgment they seem to be of some benefit.  Patient wanted to proceed with treatment options provided.  Continue baricitinib, started 8/31, for 14 days or until hospital discharge. Monitor Cr, LFTs, differential. Keep on VTE ppx.   Obesity Estimated body mass index is 43.3 kg/m as calculated from the following:   Height as of this  encounter: 5\' 9"  (1.753 m).   Weight as of this encounter: 133 kg.   DVT Prophylaxis: Lovenox Code Status: Full code Family Communication: Discussed with the patient Disposition Plan:   Hopefully return home when improved.  Status is: Inpatient  Remains inpatient appropriate because:IV treatments appropriate due to intensity of illness or inability to take PO, Inpatient level of care appropriate due to severity of illness and Acute respiratory failure with hypoxia   Dispo: The  patient is from: Home              Anticipated d/c is to: Home              Anticipated d/c date is: 3 days              Patient currently is not medically stable to d/c.      Medications:  Scheduled: . albuterol  2 puff Inhalation Q6H  . albuterol  4 puff Inhalation Once  . vitamin C  500 mg Oral Daily  . baricitinib  4 mg Oral Daily  . enoxaparin (LOVENOX) injection  40 mg Subcutaneous Q24H  . famotidine  20 mg Oral BID  . methylPREDNISolone (SOLU-MEDROL) injection  0.25 mg/kg Intravenous Q12H  . sodium chloride flush  3 mL Intravenous Q12H  . zinc sulfate  220 mg Oral Daily   Continuous: . remdesivir 100 mg in NS 100 mL     RXV:[id number], chlorpheniramine-HYDROcodone, guaiFENesin-dextromethorphan, ondansetron **OR** ondansetron (ZOFRAN) IV   Objective:  Vital Signs  Vitals:   11/06/19 0430 11/06/19 0500 11/06/19 0600 11/06/19 0843  BP: (!) 158/115 (!) 152/101 (!) 142/79 (!) 157/86  Pulse: (!) 58 66 (!) 54 76  Resp: (!) 31 (!) 28 (!) 21 (!) 26  Temp:    98.5 F (36.9 C)  TempSrc:    Oral  SpO2: (!) 87% 93% 91% 91%  Weight:      Height:        Intake/Output Summary (Last 24 hours) at 11/06/2019 1038 Last data filed at 11/06/2019 0936 Gross per 24 hour  Intake --  Output 1400 ml  Net -1400 ml   Filed Weights   11/05/19 1059  Weight: 133 kg    General appearance: Awake alert.  In no distress Resp: Noted to be tachypneic at rest.  No use of accessory muscles.  Crackles bilateral bases.  No wheezing or rhonchi.   Cardio: S1-S2 is normal regular.  No S3-S4.  No rubs murmurs or bruit GI: Abdomen is soft.  Nontender nondistended.  Bowel sounds are present normal.  No masses organomegaly Extremities: No edema.  Full range of motion of lower extremities. Neurologic: Alert and oriented x3.  No focal neurological deficits.    Lab Results:  Data Reviewed: I have personally reviewed following labs and imaging studies  CBC: Recent Labs  Lab 10/31/19 0137  11/05/19 1045 11/06/19 0442  WBC 6.9 12.2* 11.8*  NEUTROABS 5.1 8.6* 8.8*  HGB 14.3 13.3 12.5*  HCT 43.3 39.9 37.9*  MCV 85.1 84.9 85.0  PLT 153 494* 481*    Basic Metabolic Panel: Recent Labs  Lab 10/31/19 0137 11/05/19 1045 11/06/19 0442  NA 132* 134* 133*  K 3.8 3.7 4.6  CL 93* 97* 96*  CO2 27 27 27   GLUCOSE 112* 105* 111*  BUN 8 8 12   CREATININE 1.20 0.86 0.82  CALCIUM 8.9 8.8* 9.0  MG  --   --  2.6*  PHOS  --   --  3.8  GFR: Estimated Creatinine Clearance: 189.5 mL/min (by C-G formula based on SCr of 0.82 mg/dL).  Liver Function Tests: Recent Labs  Lab 10/31/19 0137 11/05/19 1045 11/06/19 0442  AST 33 23 21  ALT 21 15 16   ALKPHOS 72 53 54  BILITOT 1.3* 1.2 0.8  PROT 7.7 7.3 7.3  ALBUMIN 3.7 3.0* 2.8*    Recent Labs  Lab 10/31/19 0137  LIPASE 32    CBG: Recent Labs  Lab 10/31/19 0124  GLUCAP 116*    Lipid Profile: Recent Labs    11/05/19 1045  TRIG 123     Anemia Panel: Recent Labs    11/05/19 1045 11/06/19 0442  FERRITIN 606* 646*    Recent Results (from the past 240 hour(s))  Blood Culture (routine x 2)     Status: None (Preliminary result)   Collection Time: 11/05/19 11:00 AM   Specimen: BLOOD LEFT HAND  Result Value Ref Range Status   Specimen Description BLOOD LEFT HAND  Final   Special Requests   Final    BOTTLES DRAWN AEROBIC AND ANAEROBIC Blood Culture adequate volume   Culture   Final    NO GROWTH < 24 HOURS Performed at [facility], [place] [address], [place], [place] [address]    Report Status PENDING  Incomplete  Blood Culture (routine x 2)     Status: None (Preliminary result)   Collection Time: 11/05/19 12:59 PM   Specimen: BLOOD  Result Value Ref Range Status   Specimen Description BLOOD SITE NOT SPECIFIED  Final   Special Requests   Final    BOTTLES DRAWN AEROBIC AND ANAEROBIC Blood Culture adequate volume   Culture   Final    NO GROWTH < 24 HOURS Performed at [facility], [place]  [address], [place], [place] [address]    Report Status PENDING  Incomplete      Radiology Studies: CT Angio Chest PE W/Cm &/Or Wo Cm  Result Date: 11/05/2019 CLINICAL DATA:  COVID pneumonia, hypoxia, dyspnea EXAM: CT ANGIOGRAPHY CHEST WITH CONTRAST TECHNIQUE: Multidetector CT imaging of the chest was performed using the standard protocol during bolus administration of intravenous contrast. Multiplanar CT image reconstructions and MIPs were obtained to evaluate the vascular anatomy. CONTRAST:  86mL OMNIPAQUE IOHEXOL 350 MG/ML SOLN COMPARISON:  10/31/2019 FINDINGS: Cardiovascular: Satisfactory opacification of the pulmonary arteries to the segmental level. No evidence of pulmonary embolism. Normal heart size. No pericardial effusion. Mediastinum/Nodes: No enlarged mediastinal, hilar, or axillary lymph nodes. Thyroid gland, trachea, and esophagus demonstrate no significant findings. Lungs/Pleura: There has been significant interval progression of widespread bilateral ground-glass pulmonary infiltrates and progressive bibasilar pulmonary consolidation in keeping with progressive pneumonic infiltrate. No pneumothorax or pleural effusion. Central airways are widely patent. Upper Abdomen: No acute abnormality. Musculoskeletal: No chest wall abnormality. No acute or significant osseous findings. Review of the MIP images confirms the above findings. IMPRESSION: 1. No evidence of pulmonary embolus. 2. Significant interval progression of widespread bilateral ground-glass pulmonary infiltrates and progressive bibasilar pulmonary consolidation in keeping with progressive pneumonic infiltrate. Electronically Signed   By: Fidela Salisbury MD   On: 11/05/2019 16:23   DG Chest Port 1 View  Result Date: 11/05/2019 CLINICAL DATA:  Respiratory distress, COVID EXAM: PORTABLE CHEST 1 VIEW COMPARISON:  10/31/2019 FINDINGS: The heart size and mediastinal contours are within normal limits. New diffuse bilateral heterogeneous airspace  opacity. The visualized skeletal structures are unremarkable. IMPRESSION: New diffuse bilateral heterogeneous airspace opacity, consistent with COVID airspace disease. Electronically Signed  By: Eddie Candle M.D.   On: 11/05/2019 11:08       LOS: 1 day   [facility] Pager on [email or web address]  11/06/2019, 10:38 AM

## 2019-11-07 DIAGNOSIS — E871 Hypo-osmolality and hyponatremia: Secondary | ICD-10-CM

## 2019-11-07 DIAGNOSIS — D72829 Elevated white blood cell count, unspecified: Secondary | ICD-10-CM

## 2019-11-07 LAB — CBC WITH DIFFERENTIAL/PLATELET
Abs Immature Granulocytes: 1.07 10*3/uL — ABNORMAL HIGH (ref 0.00–0.07)
Basophils Absolute: 0.1 10*3/uL (ref 0.0–0.1)
Basophils Relative: 0 %
Eosinophils Absolute: 0 10*3/uL (ref 0.0–0.5)
Eosinophils Relative: 0 %
HCT: 38.4 % — ABNORMAL LOW (ref 39.0–52.0)
Hemoglobin: 12.9 g/dL — ABNORMAL LOW (ref 13.0–17.0)
Immature Granulocytes: 4 %
Lymphocytes Relative: 9 %
Lymphs Abs: 2.2 10*3/uL (ref 0.7–4.0)
MCH: 28.4 pg (ref 26.0–34.0)
MCHC: 33.6 g/dL (ref 30.0–36.0)
MCV: 84.6 fL (ref 80.0–100.0)
Monocytes Absolute: 2.4 10*3/uL — ABNORMAL HIGH (ref 0.1–1.0)
Monocytes Relative: 9 %
Neutro Abs: 20.1 10*3/uL — ABNORMAL HIGH (ref 1.7–7.7)
Neutrophils Relative %: 78 %
Platelets: 709 10*3/uL — ABNORMAL HIGH (ref 150–400)
RBC: 4.54 MIL/uL (ref 4.22–5.81)
RDW: 13.2 % (ref 11.5–15.5)
WBC: 25.9 10*3/uL — ABNORMAL HIGH (ref 4.0–10.5)
nRBC: 0.2 % (ref 0.0–0.2)

## 2019-11-07 LAB — COMPREHENSIVE METABOLIC PANEL
ALT: 19 U/L (ref 0–44)
AST: 22 U/L (ref 15–41)
Albumin: 3.1 g/dL — ABNORMAL LOW (ref 3.5–5.0)
Alkaline Phosphatase: 44 U/L (ref 38–126)
Anion gap: 10 (ref 5–15)
BUN: 15 mg/dL (ref 6–20)
CO2: 26 mmol/L (ref 22–32)
Calcium: 9.1 mg/dL (ref 8.9–10.3)
Chloride: 100 mmol/L (ref 98–111)
Creatinine, Ser: 0.83 mg/dL (ref 0.61–1.24)
GFR calc Af Amer: >60 mL/min (ref >60)
GFR calc non Af Amer: >60 mL/min (ref >60)
Glucose, Bld: 95 mg/dL (ref 70–99)
Potassium: 4 mmol/L (ref 3.5–5.1)
Sodium: 136 mmol/L (ref 135–145)
Total Bilirubin: 0.8 mg/dL (ref 0.3–1.2)
Total Protein: 7.1 g/dL (ref 6.5–8.1)

## 2019-11-07 LAB — LEGIONELLA PNEUMOPHILA SEROGP 1 UR AG: L. pneumophila Serogp 1 Ur Ag: NEGATIVE

## 2019-11-07 LAB — D-DIMER, QUANTITATIVE: D-Dimer, Quant: 1.48 ug/mL-FEU — ABNORMAL HIGH (ref 0.00–0.50)

## 2019-11-07 LAB — C-REACTIVE PROTEIN: CRP: 6.1 mg/dL — ABNORMAL HIGH (ref <1.0)

## 2019-11-07 LAB — MAGNESIUM: Magnesium: 2.2 mg/dL (ref 1.7–2.4)

## 2019-11-07 MED ORDER — ENOXAPARIN SODIUM 80 MG/0.8ML ~~LOC~~ SOLN
70.0000 mg | SUBCUTANEOUS | Status: DC
  Administered 2019-11-07 – 2019-11-08 (×2): 70 mg via SUBCUTANEOUS
  Filled 2019-11-07 (×2): qty 0.8

## 2019-11-07 NOTE — Progress Notes (Addendum)
PROGRESS NOTE  Micheal Hall IEP:[id number] DOB: [birth year] DOA: 11/05/2019  PCP: Patient, No Pcp Per  Brief History/Interval Summary: Male with obesity with no other significant past medical history presents with shortness of breath and weakness over the last 2 weeks.  He apparently tested positive for COVID-19 on 8/18.  The result was verified by the emergency department staff.  Patient underwent evaluation in the ED apparently he had a syncopal episode with EMS.  CT angiogram did not show any PE but did show evidence for interstitial opacities.  Patient was hospitalized for further management.  He was noted to be hypoxic as well.  Reason for Visit: Pneumonia due to COVID-19  Consultants: None  Procedures: None  Antibiotics: Anti-infectives (From admission, onward)   Start     Dose/Rate Route Frequency Ordered Stop   11/06/19 1000  remdesivir 100 mg in sodium chloride 0.9 % 100 mL IVPB       "Followed by" Linked Group Details   100 mg 200 mL/hr over 30 Minutes Intravenous Daily 11/05/19 1230 11/10/19 0959   11/05/19 1330  remdesivir 200 mg in sodium chloride 0.9% 250 mL IVPB       "Followed by" Linked Group Details   200 mg 580 mL/hr over 30 Minutes Intravenous Once 11/05/19 1230 11/05/19 1548      Subjective/Interval History: Patient states that he is feeling better today compared to yesterday.  Not as short of breath.  Continues to have cough with clear expectoration.  No chest pain.  Able to ambulate.     Assessment/Plan:  Acute Hypoxic Resp. Failure/Pneumonia due to COVID-19/sepsis, POA, resolved   Recent Labs  Lab 11/05/19 1045 11/06/19 0442 11/07/19 1105  DDIMER 2.36* 1.95* 1.48*  FERRITIN 606* 646*  --   CRP 13.8* 15.3* 6.1*  ALT 15 16 19   PROCALCITON <0.10  --   --     Objective findings: Fever: Remains afebrile Oxygen requirements: Oxygen requirements appear to have improved.  He is now down to 5 L by nasal cannula.  Yesterday he was requiring high flow  nasal cannula at 12 L/min.    COVID 19 Therapeutics: Antibacterials: None.  Procalcitonin was less than 0.1 Remdesivir: Day 3 Steroids: Solu-Medrol Diuretics: Not given yet Actemra/Baricitinib: On baricitinib day 3 PUD Prophylaxis: On Pepcid DVT Prophylaxis:  Lovenox 40 mg once daily  From a respiratory standpoint patient seems to be improving.  His oxygen requirements have decreased.  Labs are pending from this morning.  Continue Solu-Medrol, Remdesivir and baricitinib.  Follow-up on labs.  CT scan done at the time of admission did not show any PE.  Continue with incentive spirometry, mobilization and prone positioning.  The treatment plan and use of medications and known side effects were discussed with patient. Some of the medications used are based on case reports/anecdotal data.  All other medications being used in the management of COVID-19 based on limited study data.  Complete risks and long-term side effects are unknown, however in the best clinical judgment they seem to be of some benefit.  Patient wanted to proceed with treatment options provided.  Hyponatremia Monitor sodium levels every so often.  Leukocytosis Secondary to steroids.  Thrombocytosis Elevated platelet counts could be reflective of inflammation.  Normocytic anemia Stable.  No evidence of overt bleeding.  Obesity Estimated body mass index is 45.3 kg/m as calculated from the following:   Height as of this encounter: 5\' 10"  (1.778 m).   Weight as of this encounter: 143.2 kg.  DVT Prophylaxis: Lovenox Code Status: Full code Family Communication: Discussed with the patient Disposition Plan:   Hopefully return home when improved.  Status is: Inpatient  Remains inpatient appropriate because:IV treatments appropriate due to intensity of illness or inability to take PO and Inpatient level of care appropriate due to severity of illness   Dispo:  Patient From: Home  Planned Disposition: Home  Expected  discharge date: 11/10/19  Medically stable for discharge: No     Medications:  Scheduled: . albuterol  2 puff Inhalation Q6H  . vitamin C  500 mg Oral Daily  . baricitinib  4 mg Oral Daily  . enoxaparin (LOVENOX) injection  70 mg Subcutaneous Q24H  . famotidine  20 mg Oral BID  . methylPREDNISolone (SOLU-MEDROL) injection  80 mg Intravenous Q12H  . sodium chloride flush  3 mL Intravenous Q12H  . zinc sulfate  220 mg Oral Daily   Continuous: . remdesivir 100 mg in NS 100 mL Stopped (11/07/19 1031)   LGX:QJJHERDEYCXKG, chlorpheniramine-HYDROcodone, guaiFENesin-dextromethorphan, ondansetron **OR** ondansetron (ZOFRAN) IV   Objective:  Vital Signs  Vitals:   11/07/19 0800 11/07/19 0804 11/07/19 1155 11/07/19 1157  BP:  (!) 148/89 135/78   Pulse:      Resp:  13 17   Temp: 98.7 F (37.1 C)  98.2 F (36.8 C) 98.2 F (36.8 C)  TempSrc: Oral  Oral Oral  SpO2:  95%    Weight:      Height:        Intake/Output Summary (Last 24 hours) at 11/07/2019 1331 Last data filed at 11/07/2019 1100 Gross per 24 hour  Intake 370 ml  Output --  Net 370 ml   Filed Weights   11/05/19 1059 11/06/19 2018  Weight: 133 kg (!) 143.2 kg    General appearance: Awake alert.  In no distress Resp: Improved effort.  Continues to be mildly tachypneic.  No use of accessory muscles.  Crackles bilateral bases.  No wheezing or rhonchi. Cardio: S1-S2 is normal regular.  No S3-S4.  No rubs murmurs or bruit GI: Abdomen is soft.  Nontender nondistended.  Bowel sounds are present normal.  No masses organomegaly Extremities: No edema.  Full range of motion of lower extremities. Neurologic: Alert and oriented x3.  No focal neurological deficits.     Lab Results:  Data Reviewed: I have personally reviewed following labs and imaging studies  CBC: Recent Labs  Lab 11/05/19 1045 11/06/19 0442 11/07/19 1105  WBC 12.2* 11.8* 25.9*  NEUTROABS 8.6* 8.8* 20.1*  HGB 13.3 12.5* 12.9*  HCT 39.9 37.9* 38.4*   MCV 84.9 85.0 84.6  PLT 494* 481* 709*    Basic Metabolic Panel: Recent Labs  Lab 11/05/19 1045 11/06/19 0442 11/07/19 1105  NA 134* 133* 136  K 3.7 4.6 4.0  CL 97* 96* 100  CO2 27 27 26   GLUCOSE 105* 111* 95  BUN 8 12 15   CREATININE 0.86 0.82 0.83  CALCIUM 8.8* 9.0 9.1  MG  --  2.6* 2.2  PHOS  --  3.8  --     GFR: Estimated Creatinine Clearance: 197.9 mL/min (by C-G formula based on SCr of 0.83 mg/dL).  Liver Function Tests: Recent Labs  Lab 11/05/19 1045 11/06/19 0442 11/07/19 1105  AST 23 21 22   ALT 15 16 19   ALKPHOS 53 54 44  BILITOT 1.2 0.8 0.8  PROT 7.3 7.3 7.1  ALBUMIN 3.0* 2.8* 3.1*     Lipid Profile: Recent Labs    11/05/19 1045  TRIG 123     Anemia Panel: Recent Labs    11/05/19 1045 11/06/19 0442  FERRITIN 606* 646*    Recent Results (from the past 240 hour(s))  Blood Culture (routine x 2)     Status: None (Preliminary result)   Collection Time: 11/05/19 11:00 AM   Specimen: BLOOD LEFT HAND  Result Value Ref Range Status   Specimen Description BLOOD LEFT HAND  Final   Special Requests   Final    BOTTLES DRAWN AEROBIC AND ANAEROBIC Blood Culture adequate volume   Culture   Final    NO GROWTH 2 DAYS Performed at [facility], [place] [address], [place], [place] [address]    Report Status PENDING  Incomplete  Blood Culture (routine x 2)     Status: None (Preliminary result)   Collection Time: 11/05/19 12:59 PM   Specimen: BLOOD  Result Value Ref Range Status   Specimen Description BLOOD SITE NOT SPECIFIED  Final   Special Requests   Final    BOTTLES DRAWN AEROBIC AND ANAEROBIC Blood Culture adequate volume   Culture   Final    NO GROWTH 2 DAYS Performed at [facility], [address] [place], [place] [address]    Report Status PENDING  Incomplete      Radiology Studies: CT Angio Chest PE W/Cm &/Or Wo Cm  Result Date: 11/05/2019 CLINICAL DATA:  COVID pneumonia, hypoxia, dyspnea EXAM: CT ANGIOGRAPHY CHEST WITH  CONTRAST TECHNIQUE: Multidetector CT imaging of the chest was performed using the standard protocol during bolus administration of intravenous contrast. Multiplanar CT image reconstructions and MIPs were obtained to evaluate the vascular anatomy. CONTRAST:  4mL OMNIPAQUE IOHEXOL 350 MG/ML SOLN COMPARISON:  10/31/2019 FINDINGS: Cardiovascular: Satisfactory opacification of the pulmonary arteries to the segmental level. No evidence of pulmonary embolism. Normal heart size. No pericardial effusion. Mediastinum/Nodes: No enlarged mediastinal, hilar, or axillary lymph nodes. Thyroid gland, trachea, and esophagus demonstrate no significant findings. Lungs/Pleura: There has been significant interval progression of widespread bilateral ground-glass pulmonary infiltrates and progressive bibasilar pulmonary consolidation in keeping with progressive pneumonic infiltrate. No pneumothorax or pleural effusion. Central airways are widely patent. Upper Abdomen: No acute abnormality. Musculoskeletal: No chest wall abnormality. No acute or significant osseous findings. Review of the MIP images confirms the above findings. IMPRESSION: 1. No evidence of pulmonary embolus. 2. Significant interval progression of widespread bilateral ground-glass pulmonary infiltrates and progressive bibasilar pulmonary consolidation in keeping with progressive pneumonic infiltrate. Electronically Signed   By: Fidela Salisbury MD   On: 11/05/2019 16:23       LOS: 2 days   [facility] Pager on [email or web address]  11/07/2019, 1:31 PM

## 2019-11-07 NOTE — TOC Initial Note (Addendum)
Transition of Care [facility]) - Initial/Assessment Note    Patient Details  Name: Micheal Hall MRN: [id number] Date of Birth: 01-16-1996  [facility]) CM/SW Contact:    Verdell Carmine, RN Phone Number: 11/07/2019, 1:26 PM  Clinical Narrative:                 Patient admitted with COVID pneumonia lives in apartment, no insurance or primary MD. On NRB and nasal o2 at this time. Was diagnosed on 8/18. Came to ED two days ago. Will likely need home oxygen and may need MATCH program. Appointment at [facility] made. They will make a primary care appointment Will continue to follow for needs.   Expected Discharge Plan: Home/Self Care Barriers to Discharge: Continued Medical Work up   Patient Goals and CMS Choice        Expected Discharge Plan and Services Expected Discharge Plan: Home/Self Care       Living arrangements for the past 2 months: Apartment                                      Prior Living Arrangements/Services Living arrangements for the past 2 months: Apartment   Patient language and need for interpreter reviewed:: Yes        Need for Family Participation in Patient Care: Yes (Comment) Care giver support system in place?: Yes (comment)      Activities of Daily Living Home Assistive Devices/Equipment: None ADL Screening (condition at time of admission) Patient's cognitive ability adequate to safely complete daily activities?: Yes Is the patient deaf or have difficulty hearing?: No Does the patient have difficulty seeing, even when wearing glasses/contacts?: No Does the patient have difficulty concentrating, remembering, or making decisions?: No Patient able to express need for assistance with ADLs?: Yes Does the patient have difficulty dressing or bathing?: No Independently performs ADLs?: Yes (appropriate for developmental age) Does the patient have difficulty walking or climbing stairs?: No Weakness of Legs: None Weakness of  Arms/Hands: None  Permission Sought/Granted                  Emotional Assessment       Orientation: : Oriented to Self, Oriented to Place, Oriented to  Time, Oriented to Situation Alcohol / Substance Use: Not Applicable Psych Involvement: No (comment)  Admission diagnosis:  Acute respiratory failure with hypoxia ([place]) [J96.01] Pneumonia due to COVID-19 virus [U07.1, J12.82] Patient Active Problem List   Diagnosis Date Noted  . Acute respiratory failure with hypoxia ([place]) 11/05/2019  . Sepsis ([place]) 11/05/2019  . Pneumonia due to COVID-19 virus 11/05/2019  . Atypical chest pain 06/18/2014  . Low back pain 06/18/2014  . Bilateral lower abdominal discomfort 04/18/2014  . Morbid obesity ([place]) 02/19/2014  . Acanthosis nigricans 02/19/2014  . High blood pressure 02/19/2014  . Allergy    PCP:  Patient, No Pcp Per Pharmacy:   [facility] [place], [place] [address] Phone: [phone] Fax: [place], [place] [address] Phone: [phone] Fax: [phone]     Social Determinants of Health (SDOH) Interventions    Readmission Risk Interventions No flowsheet data found.

## 2019-11-08 LAB — COMPREHENSIVE METABOLIC PANEL
ALT: 22 U/L (ref 0–44)
AST: 21 U/L (ref 15–41)
Albumin: 2.8 g/dL — ABNORMAL LOW (ref 3.5–5.0)
Alkaline Phosphatase: 50 U/L (ref 38–126)
Anion gap: 9 (ref 5–15)
BUN: 15 mg/dL (ref 6–20)
CO2: 24 mmol/L (ref 22–32)
Calcium: 9.1 mg/dL (ref 8.9–10.3)
Chloride: 100 mmol/L (ref 98–111)
Creatinine, Ser: 0.85 mg/dL (ref 0.61–1.24)
GFR calc Af Amer: >60 mL/min (ref >60)
GFR calc non Af Amer: >60 mL/min (ref >60)
Glucose, Bld: 108 mg/dL — ABNORMAL HIGH (ref 70–99)
Potassium: 4.5 mmol/L (ref 3.5–5.1)
Sodium: 133 mmol/L — ABNORMAL LOW (ref 135–145)
Total Bilirubin: 0.9 mg/dL (ref 0.3–1.2)
Total Protein: 6.7 g/dL (ref 6.5–8.1)

## 2019-11-08 LAB — CBC WITH DIFFERENTIAL/PLATELET
Abs Immature Granulocytes: 1.03 10*3/uL — ABNORMAL HIGH (ref 0.00–0.07)
Basophils Absolute: 0.1 10*3/uL (ref 0.0–0.1)
Basophils Relative: 0 %
Eosinophils Absolute: 0 10*3/uL (ref 0.0–0.5)
Eosinophils Relative: 0 %
HCT: 37.4 % — ABNORMAL LOW (ref 39.0–52.0)
Hemoglobin: 12.6 g/dL — ABNORMAL LOW (ref 13.0–17.0)
Immature Granulocytes: 5 %
Lymphocytes Relative: 9 %
Lymphs Abs: 1.9 10*3/uL (ref 0.7–4.0)
MCH: 28.6 pg (ref 26.0–34.0)
MCHC: 33.7 g/dL (ref 30.0–36.0)
MCV: 84.8 fL (ref 80.0–100.0)
Monocytes Absolute: 1 10*3/uL (ref 0.1–1.0)
Monocytes Relative: 4 %
Neutro Abs: 18.1 10*3/uL — ABNORMAL HIGH (ref 1.7–7.7)
Neutrophils Relative %: 82 %
Platelets: 658 10*3/uL — ABNORMAL HIGH (ref 150–400)
RBC: 4.41 MIL/uL (ref 4.22–5.81)
RDW: 13.2 % (ref 11.5–15.5)
WBC: 22.1 10*3/uL — ABNORMAL HIGH (ref 4.0–10.5)
nRBC: 0.1 % (ref 0.0–0.2)

## 2019-11-08 LAB — D-DIMER, QUANTITATIVE: D-Dimer, Quant: 1.09 ug/mL-FEU — ABNORMAL HIGH (ref 0.00–0.50)

## 2019-11-08 LAB — MAGNESIUM: Magnesium: 2.2 mg/dL (ref 1.7–2.4)

## 2019-11-08 LAB — C-REACTIVE PROTEIN: CRP: 3.8 mg/dL — ABNORMAL HIGH (ref <1.0)

## 2019-11-08 MED ORDER — HYDRALAZINE HCL 10 MG PO TABS
10.0000 mg | ORAL_TABLET | Freq: Three times a day (TID) | ORAL | Status: DC | PRN
  Administered 2019-11-08: 10 mg via ORAL
  Filled 2019-11-08: qty 1

## 2019-11-08 NOTE — Progress Notes (Signed)
   11/08/19 0007  Assess: MEWS Score  BP (!) 150/107  ECG Heart Rate (!) 59  Resp (!) 23  SpO2 95 %  O2 Device Room Air  Patient Activity (if Appropriate) In bed  Assess: MEWS Score  MEWS Temp 0  MEWS Systolic 0  MEWS Pulse 0  MEWS RR 1  MEWS LOC 0  MEWS Score 1  MEWS Score Color Green  Notify: Provider  Provider Ernestina Columbia, MD  Date Provider Notified 11/08/19  Time Provider Notified [phone]  Notification Type Page  Notification Reason Other (Comment) (Hypertension.)  Response See new orders  Date of Provider Response 11/08/19  Time of Provider Response 0023  Document  Patient Outcome Stabilized after interventions   BP 145/92. Will continue to monitor.

## 2019-11-08 NOTE — Progress Notes (Signed)
SATURATION QUALIFICATIONS: (This note is used to comply with regulatory documentation for home oxygen)  Patient Saturations on Room Air at Rest = 98%  Patient Saturations on Room Air while Ambulating = 90%  Please briefly explain why patient needs home oxygen: N/A, Pt does not meet requirements of needing home oxygen therapies.

## 2019-11-08 NOTE — Progress Notes (Signed)
PROGRESS NOTE  Micheal Hall KXF:[id number] DOB: 09-03-1995 DOA: 11/05/2019  PCP: Patient, No Pcp Per  Brief History/Interval Summary: Male with obesity with no other significant past medical history presents with shortness of breath and weakness over the last 2 weeks.  He apparently tested positive for COVID-19 on 8/18.  The result was verified by the emergency department staff.  Patient underwent evaluation in the ED apparently he had a syncopal episode with EMS.  CT angiogram did not show any PE but did show evidence for interstitial opacities.  Patient was hospitalized for further management.  He was noted to be hypoxic as well.  Reason for Visit: Pneumonia due to COVID-19  Consultants: None  Procedures: None  Antibiotics: Anti-infectives (From admission, onward)   Start     Dose/Rate Route Frequency Ordered Stop   11/06/19 1000  remdesivir 100 mg in sodium chloride 0.9 % 100 mL IVPB       "Followed by" Linked Group Details   100 mg 200 mL/hr over 30 Minutes Intravenous Daily 11/05/19 1230 11/10/19 0959   11/05/19 1330  remdesivir 200 mg in sodium chloride 0.9% 250 mL IVPB       "Followed by" Linked Group Details   200 mg 580 mL/hr over 30 Minutes Intravenous Once 11/05/19 1230 11/05/19 1548      Subjective/Interval History: Patient continues to improve.  Feeling less short of breath compared to yesterday.  Able to move around without much difficulty.     Assessment/Plan:  Acute Hypoxic Resp. Failure/Pneumonia due to COVID-19/sepsis, POA, resolved   Recent Labs  Lab 11/05/19 1045 11/06/19 0442 11/07/19 1105 11/08/19 0323  DDIMER 2.36* 1.95* 1.48* 1.09*  FERRITIN 606* 646*  --   --   CRP 13.8* 15.3* 6.1* 3.8*  ALT 15 16 19 22   PROCALCITON <0.10  --   --   --     Objective findings: Oxygen requirements: Currently on 2 L of oxygen by nasal cannula.  Significantly improved from 2 days ago when he was on 12 L/min.     COVID 19 Therapeutics: Antibacterials: None.   Procalcitonin was less than 0.1 Remdesivir: Day 4 Steroids: Solu-Medrol Diuretics: Not given yet Actemra/Baricitinib: On baricitinib day 4 PUD Prophylaxis: On Pepcid DVT Prophylaxis:  Lovenox 40 mg once daily  From a respiratory standpoint patient seems to be improving.  His oxygen requirements have decreased.  Hopefully can wean him down to room air.  Continue Remdesivir Solu-Medrol and baricitinib for now.  Inflammatory markers have also improved.  CRP is down to 3.8.  D-dimer 1.09.  Is due to steroids.  CT scan done at the time of admission did not show any PE.  Continue with incentive spirometry, mobilization and prone positioning.  The treatment plan and use of medications and known side effects were discussed with patient. Some of the medications used are based on case reports/anecdotal data.  All other medications being used in the management of COVID-19 based on limited study data.  Complete risks and long-term side effects are unknown, however in the best clinical judgment they seem to be of some benefit.  Patient wanted to proceed with treatment options provided.  Hyponatremia Sodium levels are stable.  Leukocytosis Secondary to steroids.  Thrombocytosis Elevated platelet counts could be reflective of inflammation.  Slightly better today.  Normocytic anemia Stable.  No evidence of overt bleeding.  Obesity Estimated body mass index is 45.3 kg/m as calculated from the following:   Height as of this encounter: 5\' 10"  (  1.778 m).   Weight as of this encounter: 143.2 kg.   DVT Prophylaxis: Lovenox Code Status: Full code Family Communication: Discussed with the patient Disposition Plan:   Hopefully return home when improved.  Anticipate discharge tomorrow.  Status is: Inpatient  Remains inpatient appropriate because:IV treatments appropriate due to intensity of illness or inability to take PO and Inpatient level of care appropriate due to severity of  illness   Dispo:  Patient From: Home  Planned Disposition: Home  Expected discharge date: 11/09/19  Medically stable for discharge: No     Medications:  Scheduled: . albuterol  2 puff Inhalation Q6H  . vitamin C  500 mg Oral Daily  . baricitinib  4 mg Oral Daily  . enoxaparin (LOVENOX) injection  70 mg Subcutaneous Q24H  . famotidine  20 mg Oral BID  . methylPREDNISolone (SOLU-MEDROL) injection  80 mg Intravenous Q12H  . sodium chloride flush  3 mL Intravenous Q12H  . zinc sulfate  220 mg Oral Daily   Continuous: . remdesivir 100 mg in NS 100 mL 100 mg (11/08/19 0942)   JTT:[id number], chlorpheniramine-HYDROcodone, guaiFENesin-dextromethorphan, hydrALAZINE, ondansetron **OR** ondansetron (ZOFRAN) IV   Objective:  Vital Signs  Vitals:   11/08/19 0100 11/08/19 0150 11/08/19 0415 11/08/19 0807  BP: (!) 163/111 (!) 145/92 (!) 147/101 (!) 146/84  Pulse:   60 66  Resp: 20 (!) 22 (!) 21 (!) 22  Temp:   97.7 F (36.5 C) 97.6 F (36.4 C)  TempSrc:   Oral Oral  SpO2: 100%  97% 99%  Weight:      Height:        Intake/Output Summary (Last 24 hours) at 11/08/2019 1139 Last data filed at 11/08/2019 0909 Gross per 24 hour  Intake 580 ml  Output --  Net 580 ml   Filed Weights   11/05/19 1059 11/06/19 2018  Weight: 133 kg (!) 143.2 kg    General appearance: Awake alert.  In no distress.  Obese Resp: Improved effort.  Few crackles bilateral bases.  No wheezing or rhonchi.   Cardio: S1-S2 is normal regular.  No S3-S4.  No rubs murmurs or bruit GI: Abdomen is soft.  Nontender nondistended.  Bowel sounds are present normal.  No masses organomegaly Extremities: No edema.  Full range of motion of lower extremities. Neurologic: Alert and oriented x3.  No focal neurological deficits.     Lab Results:  Data Reviewed: I have personally reviewed following labs and imaging studies  CBC: Recent Labs  Lab 11/05/19 1045 11/06/19 0442 11/07/19 1105 11/08/19 0323  WBC  12.2* 11.8* 25.9* 22.1*  NEUTROABS 8.6* 8.8* 20.1* 18.1*  HGB 13.3 12.5* 12.9* 12.6*  HCT 39.9 37.9* 38.4* 37.4*  MCV 84.9 85.0 84.6 84.8  PLT 494* 481* 709* 658*    Basic Metabolic Panel: Recent Labs  Lab 11/05/19 1045 11/06/19 0442 11/07/19 1105 11/08/19 0323  NA 134* 133* 136 133*  K 3.7 4.6 4.0 4.5  CL 97* 96* 100 100  CO2 27 27 26 24   GLUCOSE 105* 111* 95 108*  BUN 8 12 15 15   CREATININE 0.86 0.82 0.83 0.85  CALCIUM 8.8* 9.0 9.1 9.1  MG  --  2.6* 2.2 2.2  PHOS  --  3.8  --   --     GFR: Estimated Creatinine Clearance: 193.3 mL/min (by C-G formula based on SCr of 0.85 mg/dL).  Liver Function Tests: Recent Labs  Lab 11/05/19 1045 11/06/19 0442 11/07/19 1105 11/08/19 0323  AST 23 21 22  21  ALT 15 16 19 22   ALKPHOS 53 54 44 50  BILITOT 1.2 0.8 0.8 0.9  PROT 7.3 7.3 7.1 6.7  ALBUMIN 3.0* 2.8* 3.1* 2.8*    Anemia Panel: Recent Labs    11/06/19 0442  FERRITIN 646*    Recent Results (from the past 240 hour(s))  Blood Culture (routine x 2)     Status: None (Preliminary result)   Collection Time: 11/05/19 11:00 AM   Specimen: BLOOD LEFT HAND  Result Value Ref Range Status   Specimen Description BLOOD LEFT HAND  Final   Special Requests   Final    BOTTLES DRAWN AEROBIC AND ANAEROBIC Blood Culture adequate volume   Culture   Final    NO GROWTH 3 DAYS Performed at [facility], [place] [address], [place], [place] [address]    Report Status PENDING  Incomplete  Blood Culture (routine x 2)     Status: None (Preliminary result)   Collection Time: 11/05/19 12:59 PM   Specimen: BLOOD  Result Value Ref Range Status   Specimen Description BLOOD SITE NOT SPECIFIED  Final   Special Requests   Final    BOTTLES DRAWN AEROBIC AND ANAEROBIC Blood Culture adequate volume   Culture   Final    NO GROWTH 3 DAYS Performed at [facility], [address] [place], [place] [address]    Report Status PENDING  Incomplete      Radiology Studies: No results  found.     LOS: 3 days   Michelle Vanhise [employer] on [email or web address]  11/08/2019, 11:39 AM

## 2019-11-09 LAB — COMPREHENSIVE METABOLIC PANEL
ALT: 36 U/L (ref 0–44)
AST: 20 U/L (ref 15–41)
Albumin: 2.7 g/dL — ABNORMAL LOW (ref 3.5–5.0)
Alkaline Phosphatase: 48 U/L (ref 38–126)
Anion gap: 6 (ref 5–15)
BUN: 12 mg/dL (ref 6–20)
CO2: 27 mmol/L (ref 22–32)
Calcium: 8.8 mg/dL — ABNORMAL LOW (ref 8.9–10.3)
Chloride: 101 mmol/L (ref 98–111)
Creatinine, Ser: 0.97 mg/dL (ref 0.61–1.24)
GFR calc Af Amer: >60 mL/min (ref >60)
GFR calc non Af Amer: >60 mL/min (ref >60)
Glucose, Bld: 132 mg/dL — ABNORMAL HIGH (ref 70–99)
Potassium: 4.6 mmol/L (ref 3.5–5.1)
Sodium: 134 mmol/L — ABNORMAL LOW (ref 135–145)
Total Bilirubin: 0.8 mg/dL (ref 0.3–1.2)
Total Protein: 6.4 g/dL — ABNORMAL LOW (ref 6.5–8.1)

## 2019-11-09 LAB — CBC WITH DIFFERENTIAL/PLATELET
Abs Immature Granulocytes: 0.2 10*3/uL — ABNORMAL HIGH (ref 0.00–0.07)
Basophils Absolute: 0 10*3/uL (ref 0.0–0.1)
Basophils Relative: 0 %
Eosinophils Absolute: 0 10*3/uL (ref 0.0–0.5)
Eosinophils Relative: 0 %
HCT: 38.6 % — ABNORMAL LOW (ref 39.0–52.0)
Hemoglobin: 13 g/dL (ref 13.0–17.0)
Lymphocytes Relative: 11 %
Lymphs Abs: 2.6 10*3/uL (ref 0.7–4.0)
MCH: 28.5 pg (ref 26.0–34.0)
MCHC: 33.7 g/dL (ref 30.0–36.0)
MCV: 84.6 fL (ref 80.0–100.0)
Monocytes Absolute: 0.9 10*3/uL (ref 0.1–1.0)
Monocytes Relative: 4 %
Myelocytes: 1 %
Neutro Abs: 19.6 10*3/uL — ABNORMAL HIGH (ref 1.7–7.7)
Neutrophils Relative %: 84 %
Platelets: 714 10*3/uL — ABNORMAL HIGH (ref 150–400)
RBC: 4.56 MIL/uL (ref 4.22–5.81)
RDW: 13.2 % (ref 11.5–15.5)
WBC: 23.3 10*3/uL — ABNORMAL HIGH (ref 4.0–10.5)
nRBC: 0 /100 WBC
nRBC: 0.1 % (ref 0.0–0.2)

## 2019-11-09 LAB — D-DIMER, QUANTITATIVE: D-Dimer, Quant: 1.06 ug/mL-FEU — ABNORMAL HIGH (ref 0.00–0.50)

## 2019-11-09 LAB — C-REACTIVE PROTEIN: CRP: 1.6 mg/dL — ABNORMAL HIGH (ref <1.0)

## 2019-11-09 LAB — MAGNESIUM: Magnesium: 2.3 mg/dL (ref 1.7–2.4)

## 2019-11-09 MED ORDER — PREDNISONE 20 MG PO TABS: ORAL_TABLET | ORAL | 0 refills | Status: DC

## 2019-11-09 MED ORDER — FAMOTIDINE 20 MG PO TABS: 20.0000 mg | ORAL_TABLET | Freq: Two times a day (BID) | ORAL | 0 refills | Status: DC

## 2019-11-09 NOTE — Discharge Instructions (Signed)
COVID-19 COVID-19 is a respiratory infection that is caused by a virus called severe acute respiratory syndrome coronavirus 2 (SARS-CoV-2). The disease is also known as coronavirus disease or novel coronavirus. In some people, the virus may not cause any symptoms. In others, it may cause a serious infection. The infection can get worse quickly and can lead to complications, such as:  Pneumonia, or infection of the lungs.  Acute respiratory distress syndrome or ARDS. This is a condition in which fluid build-up in the lungs prevents the lungs from filling with air and passing oxygen into the blood.  Acute respiratory failure. This is a condition in which there is not enough oxygen passing from the lungs to the body or when carbon dioxide is not passing from the lungs out of the body.  Sepsis or septic shock. This is a serious bodily reaction to an infection.  Blood clotting problems.  Secondary infections due to bacteria or fungus.  Organ failure. This is when your body's organs stop working. The virus that causes COVID-19 is contagious. This means that it can spread from person to person through droplets from coughs and sneezes (respiratory secretions). What are the causes? This illness is caused by a virus. You may catch the virus by:  Breathing in droplets from an infected person. Droplets can be spread by a person breathing, speaking, singing, coughing, or sneezing.  Touching something, like a table or a doorknob, that was exposed to the virus (contaminated) and then touching your mouth, nose, or eyes. What increases the risk? Risk for infection You are more likely to be infected with this virus if you:  Are within 6 feet (2 meters) of a person with COVID-19.  Provide care for or live with a person who is infected with COVID-19.  Spend time in crowded indoor spaces or live in shared housing. Risk for serious illness You are more likely to become seriously ill from the virus if you:   Are 50 years of age or older. The higher your age, the more you are at risk for serious illness.  Live in a nursing home or long-term care facility.  Have cancer.  Have a long-term (chronic) disease such as: ? Chronic lung disease, including chronic obstructive pulmonary disease or asthma. ? A long-term disease that lowers your body's ability to fight infection (immunocompromised). ? Heart disease, including heart failure, a condition in which the arteries that lead to the heart become narrow or blocked (coronary artery disease), a disease which makes the heart muscle thick, weak, or stiff (cardiomyopathy). ? Diabetes. ? Chronic kidney disease. ? Sickle cell disease, a condition in which red blood cells have an abnormal "sickle" shape. ? Liver disease.  Are obese. What are the signs or symptoms? Symptoms of this condition can range from mild to severe. Symptoms may appear any time from 2 to 14 days after being exposed to the virus. They include:  A fever or chills.  A cough.  Difficulty breathing.  Headaches, body aches, or muscle aches.  Runny or stuffy (congested) nose.  A sore throat.  New loss of taste or smell. Some people may also have stomach problems, such as nausea, vomiting, or diarrhea. Other people may not have any symptoms of COVID-19. How is this diagnosed? This condition may be diagnosed based on:  Your signs and symptoms, especially if: ? You live in an area with a COVID-19 outbreak. ? You recently traveled to or from an area where the virus is common. ? You   provide care for or live with a person who was diagnosed with COVID-19. ? You were exposed to a person who was diagnosed with COVID-19.  A physical exam.  Lab tests, which may include: ? Taking a sample of fluid from the back of your nose and throat (nasopharyngeal fluid), your nose, or your throat using a swab. ? A sample of mucus from your lungs (sputum). ? Blood tests.  Imaging tests, which  may include, X-rays, CT scan, or ultrasound. How is this treated? At present, there is no medicine to treat COVID-19. Medicines that treat other diseases are being used on a trial basis to see if they are effective against COVID-19. Your health care provider will talk with you about ways to treat your symptoms. For most people, the infection is mild and can be managed at home with rest, fluids, and over-the-counter medicines. Treatment for a serious infection usually takes places in a hospital intensive care unit (ICU). It may include one or more of the following treatments. These treatments are given until your symptoms improve.  Receiving fluids and medicines through an IV.  Supplemental oxygen. Extra oxygen is given through a tube in the nose, a face mask, or a hood.  Positioning you to lie on your stomach (prone position). This makes it easier for oxygen to get into the lungs.  Continuous positive airway pressure (CPAP) or bi-level positive airway pressure (BPAP) machine. This treatment uses mild air pressure to keep the airways open. A tube that is connected to a motor delivers oxygen to the body.  Ventilator. This treatment moves air into and out of the lungs by using a tube that is placed in your windpipe.  Tracheostomy. This is a procedure to create a hole in the neck so that a breathing tube can be inserted.  Extracorporeal membrane oxygenation (ECMO). This procedure gives the lungs a chance to recover by taking over the functions of the heart and lungs. It supplies oxygen to the body and removes carbon dioxide. Follow these instructions at home: Lifestyle  If you are sick, stay home except to get medical care. Your health care provider will tell you how long to stay home. Call your health care provider before you go for medical care.  Rest at home as told by your health care provider.  Do not use any products that contain nicotine or tobacco, such as cigarettes, e-cigarettes, and  chewing tobacco. If you need help quitting, ask your health care provider.  Return to your normal activities as told by your health care provider. Ask your health care provider what activities are safe for you. General instructions  Take over-the-counter and prescription medicines only as told by your health care provider.  Drink enough fluid to keep your urine pale yellow.  Keep all follow-up visits as told by your health care provider. This is important. How is this prevented?  There is no vaccine to help prevent COVID-19 infection. However, there are steps you can take to protect yourself and others from this virus. To protect yourself:   Do not travel to areas where COVID-19 is a risk. The areas where COVID-19 is reported change often. To identify high-risk areas and travel restrictions, check the CDC travel website: wwwnc.cdc.gov/travel/notices  If you live in, or must travel to, an area where COVID-19 is a risk, take precautions to avoid infection. ? Stay away from people who are sick. ? Wash your hands often with soap and water for 20 seconds. If soap and water   are not available, use an alcohol-based hand sanitizer. ? Avoid touching your mouth, face, eyes, or nose. ? Avoid going out in public, follow guidance from your state and local health authorities. ? If you must go out in public, wear a cloth face covering or face mask. Make sure your mask covers your nose and mouth. ? Avoid crowded indoor spaces. Stay at least 6 feet (2 meters) away from others. ? Disinfect objects and surfaces that are frequently touched every day. This may include:  Counters and tables.  Doorknobs and light switches.  Sinks and faucets.  Electronics, such as phones, remote controls, keyboards, computers, and tablets. To protect others: If you have symptoms of COVID-19, take steps to prevent the virus from spreading to others.  If you think you have a COVID-19 infection, contact your health care  provider right away. Tell your health care team that you think you may have a COVID-19 infection.  Stay home. Leave your house only to seek medical care. Do not use public transport.  Do not travel while you are sick.  Wash your hands often with soap and water for 20 seconds. If soap and water are not available, use alcohol-based hand sanitizer.  Stay away from other members of your household. Let healthy household members care for children and pets, if possible. If you have to care for children or pets, wash your hands often and wear a mask. If possible, stay in your own room, separate from others. Use a different bathroom.  Make sure that all people in your household wash their hands well and often.  Cough or sneeze into a tissue or your sleeve or elbow. Do not cough or sneeze into your hand or into the air.  Wear a cloth face covering or face mask. Make sure your mask covers your nose and mouth. Where to find more information  Centers for Disease Control and Prevention: www.cdc.gov/coronavirus/2019-ncov/index.html  World Health Organization: www.who.int/health-topics/coronavirus Contact a health care provider if:  You live in or have traveled to an area where COVID-19 is a risk and you have symptoms of the infection.  You have had contact with someone who has COVID-19 and you have symptoms of the infection. Get help right away if:  You have trouble breathing.  You have pain or pressure in your chest.  You have confusion.  You have bluish lips and fingernails.  You have difficulty waking from sleep.  You have symptoms that get worse. These symptoms may represent a serious problem that is an emergency. Do not wait to see if the symptoms will go away. Get medical help right away. Call your local emergency services (911 in the [place] Do not drive yourself to the hospital. Let the emergency medical personnel know if you think you have COVID-19. Summary  COVID-19 is a  respiratory infection that is caused by a virus. It is also known as coronavirus disease or novel coronavirus. It can cause serious infections, such as pneumonia, acute respiratory distress syndrome, acute respiratory failure, or sepsis.  The virus that causes COVID-19 is contagious. This means that it can spread from person to person through droplets from breathing, speaking, singing, coughing, or sneezing.  You are more likely to develop a serious illness if you are 50 years of age or older, have a weak immune system, live in a nursing home, or have chronic disease.  There is no medicine to treat COVID-19. Your health care provider will talk with you about ways to treat your symptoms.    Take steps to protect yourself and others from infection. Wash your hands often and disinfect objects and surfaces that are frequently touched every day. Stay away from people who are sick and wear a mask if you are sick. This information is not intended to replace advice given to you by your health care provider. Make sure you discuss any questions you have with your health care provider. Document Revised: 12/21/2018 Document Reviewed: 03/29/2018 Elsevier Patient Education  2020 [employer]  

## 2019-11-09 NOTE — Plan of Care (Signed)

## 2019-11-09 NOTE — TOC Transition Note (Signed)
Transition of Care [facility]) - CM/SW Discharge Note   Patient Details  Name: Micheal Hall MRN: [id number] Date of Birth: 10-Nov-1995  [facility]) CM/SW Contact:  Carles Collet, RN Phone Number: 11/09/2019, 11:33 AM   Clinical Narrative:   Plan to return to home today. Does not require home O2, Rx approx $13 cash price at [facility]. Appointment at [place] [facility] on [address] No CM needs.       Barriers to Discharge: Continued Medical Work up   Patient Goals and CMS Choice        Discharge Placement                       Discharge Plan and Services                                     Social Determinants of Health (SDOH) Interventions     Readmission Risk Interventions No flowsheet data found.

## 2019-11-09 NOTE — Discharge Summary (Signed)
[facility]  Physician Discharge Summary   Patient ID: Micheal Hall MRN: [id number] DOB/AGE: Aug 18, 1995 24 y.o.  Admit date: 11/05/2019 Discharge date: 11/09/2019  PCP: Patient, No Pcp Per  DISCHARGE DIAGNOSES:  Pneumonia due to COVID-19 Acute respiratory failure with hypoxia, resolved Normocytic anemia  RECOMMENDATIONS FOR OUTPATIENT FOLLOW UP: 1. Outpatient follow-up at the [facility]: None Equipment/Devices: None  CODE STATUS: Full code  DISCHARGE CONDITION: fair  Diet recommendation: As before  INITIAL HISTORY: 24 year old male with obesity with no other significant past medical history presents with shortness of breath and weakness over the last 2 weeks.  He apparently tested positive for COVID-19 on 8/18.  The result was verified by the emergency department staff.  Patient underwent evaluation in the ED apparently he had a syncopal episode with EMS.  CT angiogram did not show any PE but did show evidence for interstitial opacities.  Patient was hospitalized for further management.  He was noted to be hypoxic as well.   HOSPITAL COURSE:   Acute Hypoxic Resp. Failure/Pneumonia due to COVID-19/sepsis, POA, resolved Patient was hospitalized.  He was initially requiring 10 to 12 L of oxygen by nasal cannula. CT scan done at the time of admission did not show any PE.  Patient was started on remdesivir, steroids and baricitinib.  Inflammatory markers are significantly elevated.  He started improving.  His oxygen requirements decreased.  He is now saturating normal on room air.  He is ambulated without any desaturations.  Inflammatory markers have improved.  He has completed 5 days of Remdesivir today.  Will be discharged on tapering doses of steroids.  Hyponatremia Sodium levels are stable.  Leukocytosis Secondary to steroids.  Thrombocytosis Elevated platelet counts could be reflective of inflammation.    Normocytic anemia Stable.  No  evidence for overt bleeding.  Obesity Estimated body mass index is 45.3 kg/m as calculated from the following:   Height as of this encounter: 5\' 10"  (1.778 m).   Weight as of this encounter: 143.2 kg.   Overall stable.  Okay for discharge home today.   PERTINENT LABS:  The results of significant diagnostics from this hospitalization (including imaging, microbiology, ancillary and laboratory) are listed below for reference.    Microbiology: Recent Results (from the past 240 hour(s))  Blood Culture (routine x 2)     Status: None (Preliminary result)   Collection Time: 11/05/19 11:00 AM   Specimen: BLOOD LEFT HAND  Result Value Ref Range Status   Specimen Description BLOOD LEFT HAND  Final   Special Requests   Final    BOTTLES DRAWN AEROBIC AND ANAEROBIC Blood Culture adequate volume   Culture   Final    NO GROWTH 3 DAYS Performed at [facility], [address] [place], [place] [address]    Report Status PENDING  Incomplete  Blood Culture (routine x 2)     Status: None (Preliminary result)   Collection Time: 11/05/19 12:59 PM   Specimen: BLOOD  Result Value Ref Range Status   Specimen Description BLOOD SITE NOT SPECIFIED  Final   Special Requests   Final    BOTTLES DRAWN AEROBIC AND ANAEROBIC Blood Culture adequate volume   Culture   Final    NO GROWTH 3 DAYS Performed at [facility], [address], [place], [place] [address]    Report Status PENDING  Incomplete     Labs:    Basic Metabolic Panel: Recent Labs  Lab 11/05/19  1045 11/06/19 0442 11/07/19 1105 11/08/19 0323 11/09/19 0552  NA 134* 133* 136 133* 134*  K 3.7 4.6 4.0 4.5 4.6  CL 97* 96* 100 100 101  CO2 27 27 26 24 27   GLUCOSE 105* 111* 95 108* 132*  BUN 8 12 15 15 12   CREATININE 0.86 0.82 0.83 0.85 0.97  CALCIUM 8.8* 9.0 9.1 9.1 8.8*  MG  --  2.6* 2.2 2.2 2.3  PHOS  --  3.8  --   --   --    Liver Function Tests: Recent Labs  Lab 11/05/19 1045 11/06/19 0442 11/07/19 1105  11/08/19 0323 11/09/19 0552  AST 23 21 22 21 20   ALT 15 16 19 22  36  ALKPHOS 53 54 44 50 48  BILITOT 1.2 0.8 0.8 0.9 0.8  PROT 7.3 7.3 7.1 6.7 6.4*  ALBUMIN 3.0* 2.8* 3.1* 2.8* 2.7*   CBC: Recent Labs  Lab 11/05/19 1045 11/06/19 0442 11/07/19 1105 11/08/19 0323 11/09/19 0552  WBC 12.2* 11.8* 25.9* 22.1* 23.3*  NEUTROABS 8.6* 8.8* 20.1* 18.1* 19.6*  HGB 13.3 12.5* 12.9* 12.6* 13.0  HCT 39.9 37.9* 38.4* 37.4* 38.6*  MCV 84.9 85.0 84.6 84.8 84.6  PLT 494* 481* 709* 658* 714*     IMAGING STUDIES CT Angio Chest PE W/Cm &/Or Wo Cm  Result Date: 11/05/2019 CLINICAL DATA:  COVID pneumonia, hypoxia, dyspnea EXAM: CT ANGIOGRAPHY CHEST WITH CONTRAST TECHNIQUE: Multidetector CT imaging of the chest was performed using the standard protocol during bolus administration of intravenous contrast. Multiplanar CT image reconstructions and MIPs were obtained to evaluate the vascular anatomy. CONTRAST:  46mL OMNIPAQUE IOHEXOL 350 MG/ML SOLN COMPARISON:  10/31/2019 FINDINGS: Cardiovascular: Satisfactory opacification of the pulmonary arteries to the segmental level. No evidence of pulmonary embolism. Normal heart size. No pericardial effusion. Mediastinum/Nodes: No enlarged mediastinal, hilar, or axillary lymph nodes. Thyroid gland, trachea, and esophagus demonstrate no significant findings. Lungs/Pleura: There has been significant interval progression of widespread bilateral ground-glass pulmonary infiltrates and progressive bibasilar pulmonary consolidation in keeping with progressive pneumonic infiltrate. No pneumothorax or pleural effusion. Central airways are widely patent. Upper Abdomen: No acute abnormality. Musculoskeletal: No chest wall abnormality. No acute or significant osseous findings. Review of the MIP images confirms the above findings. IMPRESSION: 1. No evidence of pulmonary embolus. 2. Significant interval progression of widespread bilateral ground-glass pulmonary infiltrates and  progressive bibasilar pulmonary consolidation in keeping with progressive pneumonic infiltrate. Electronically Signed   By: Fidela Salisbury MD   On: 11/05/2019 16:23   CT Angio Chest PE W and/or Wo Contrast  Result Date: 10/31/2019 CLINICAL DATA:  24 year old male with concern for pulmonary embolism. EXAM: CT ANGIOGRAPHY CHEST WITH CONTRAST TECHNIQUE: Multidetector CT imaging of the chest was performed using the standard protocol during bolus administration of intravenous contrast. Multiplanar CT image reconstructions and MIPs were obtained to evaluate the vascular anatomy. CONTRAST:  1mL OMNIPAQUE IOHEXOL 350 MG/ML SOLN COMPARISON:  Chest radiograph dated 10/31/2019. FINDINGS: Cardiovascular: There is no cardiomegaly or pericardial effusion. The thoracic aorta is unremarkable. The origins of the great vessels of the aortic arch appear patent. Evaluation of the pulmonary arteries is limited due to respiratory motion artifact and suboptimal opacification of the peripheral branches. No central pulmonary artery embolus identified. Mediastinum/Nodes: Mildly enlarged right hilar lymph node measures 14 mm. The esophagus is grossly unremarkable. No mediastinal fluid collection. Lungs/Pleura: Bilateral confluent airspace opacities consistent with multifocal pneumonia, likely viral or atypical in etiology including COVID-19. Clinical correlation is recommended. No pleural effusion pneumothorax. The central  airways are patent. Upper Abdomen: No acute abnormality. Musculoskeletal: No chest wall abnormality. No acute or significant osseous findings. Review of the MIP images confirms the above findings. IMPRESSION: 1. No CT evidence of central pulmonary artery embolus. 2. Multifocal pneumonia in keeping with COVID-19. Clinical correlation is recommended. 3. Mildly enlarged right hilar lymph node, likely reactive. Electronically Signed   By: Anner Crete M.D.   On: 10/31/2019 03:24   DG Chest Port 1 View  Result Date:  11/05/2019 CLINICAL DATA:  Respiratory distress, COVID EXAM: PORTABLE CHEST 1 VIEW COMPARISON:  10/31/2019 FINDINGS: The heart size and mediastinal contours are within normal limits. New diffuse bilateral heterogeneous airspace opacity. The visualized skeletal structures are unremarkable. IMPRESSION: New diffuse bilateral heterogeneous airspace opacity, consistent with COVID airspace disease. Electronically Signed   By: Eddie Candle M.D.   On: 11/05/2019 11:08   DG Chest Port 1 View  Result Date: 10/31/2019 CLINICAL DATA:  24 year old male with shortness of breath. Positive COVID-19. EXAM: PORTABLE CHEST 1 VIEW COMPARISON:  None. FINDINGS: Faint bilateral pulmonary densities, left greater right most concerning for developing infiltrate, likely viral or atypical in etiology and in keeping with COVID-19. Clinical correlation is recommended. No pleural effusion pneumothorax. The cardiac silhouette is within limits. No acute osseous pathology. IMPRESSION: Findings most concerning for developing infiltrate. Electronically Signed   By: Anner Crete M.D.   On: 10/31/2019 01:25    DISCHARGE EXAMINATION: Vitals:   11/09/19 0454 11/09/19 0600 11/09/19 0700 11/09/19 0754  BP: 134/87   (!) 147/94  Pulse: 63   (!) 102  Resp: 20   16  Temp: 98 F (36.7 C)  97.8 F (36.6 C) 97.8 F (36.6 C)  TempSrc: Oral   Oral  SpO2: 92% 93%  90%  Weight:      Height:       General appearance: Awake alert.  In no distress Resp: Clear to auscultation bilaterally.  Normal effort Cardio: S1-S2 is normal regular.  No S3-S4.  No rubs murmurs or bruit GI: Abdomen is soft.  Nontender nondistended.  Bowel sounds are present normal.  No masses organomegaly   DISPOSITION: Home  Discharge Instructions    Call MD for:  difficulty breathing, headache or visual disturbances   Complete by: As directed    Call MD for:  extreme fatigue   Complete by: As directed    Call MD for:  persistant dizziness or light-headedness    Complete by: As directed    Call MD for:  persistant nausea and vomiting   Complete by: As directed    Call MD for:  severe uncontrolled pain   Complete by: As directed    Call MD for:  temperature >100.4   Complete by: As directed    Diet - low sodium heart healthy   Complete by: As directed    Discharge instructions   Complete by: As directed    COVID 19 INSTRUCTIONS  - You are felt to be stable enough to no longer require inpatient monitoring, testing, and treatment, though you will need to follow the recommendations below: - Based on the CDC's non-test criteria for ending self-isolation: You may not return to work/leave the home until at least 20 days since symptom onset AND 24 hours without a fever (without taking tylenol, ibuprofen, etc.) AND have improvement in respiratory symptoms. - Do not take NSAID medications (including, but not limited to, ibuprofen, advil, motrin, naproxen, aleve, goody's powder, etc.) - Follow up with your doctor in the  next week via telehealth or seek medical attention right away if your symptoms get WORSE.    Directions for you at home:  Wear a facemask You should wear a facemask that covers your nose and mouth when you are in the same room with other people and when you visit a healthcare provider. People who live with or visit you should also wear a facemask while they are in the same room with you.  Separate yourself from other people in your home As much as possible, you should stay in a different room from other people in your home. Also, you should use a separate bathroom, if available.  Avoid sharing household items You should not share dishes, drinking glasses, cups, eating utensils, towels, bedding, or other items with other people in your home. After using these items, you should wash them thoroughly with soap and water.  Cover your coughs and sneezes Cover your mouth and nose with a tissue when you cough or sneeze, or you can cough or  sneeze into your sleeve. Throw used tissues in a lined trash can, and immediately wash your hands with soap and water for at least 20 seconds or use an alcohol-based hand rub.  Wash your [facility] your hands often and thoroughly with soap and water for at least 20 seconds. You can use an alcohol-based hand sanitizer if soap and water are not available and if your hands are not visibly dirty. Avoid touching your eyes, nose, and mouth with unwashed hands.  Directions for those who live with, or provide care at home for you:  Limit the number of people who have contact with the patient If possible, have only one caregiver for the patient. Other household members should stay in another home or place of residence. If this is not possible, they should stay in another room, or be separated from the patient as much as possible. Use a separate bathroom, if available. Restrict visitors who do not have an essential need to be in the home.  Ensure good ventilation Make sure that shared spaces in the home have good air flow, such as from an air conditioner or an opened window, weather permitting.  Wash your hands often Wash your hands often and thoroughly with soap and water for at least 20 seconds. You can use an alcohol based hand sanitizer if soap and water are not available and if your hands are not visibly dirty. Avoid touching your eyes, nose, and mouth with unwashed hands. Use disposable paper towels to dry your hands. If not available, use dedicated cloth towels and replace them when they become wet.  Wear a facemask and gloves Wear a disposable facemask at all times in the room and gloves when you touch or have contact with the patient's blood, body fluids, and/or secretions or excretions, such as sweat, saliva, sputum, nasal mucus, vomit, urine, or feces.  Ensure the mask fits over your nose and mouth tightly, and do not touch it during use. Throw out disposable facemasks and gloves after  using them. Do not reuse. Wash your hands immediately after removing your facemask and gloves. If your personal clothing becomes contaminated, carefully remove clothing and launder. Wash your hands after handling contaminated clothing. Place all used disposable facemasks, gloves, and other waste in a lined container before disposing them with other household waste. Remove gloves and wash your hands immediately after handling these items.  Do not share dishes, glasses, or other household items with the patient Avoid sharing household  items. You should not share dishes, drinking glasses, cups, eating utensils, towels, bedding, or other items with a patient who is confirmed to have, or being evaluated for, COVID-19 infection. After the person uses these items, you should wash them thoroughly with soap and water.  Wash laundry thoroughly Immediately remove and wash clothes or bedding that have blood, body fluids, and/or secretions or excretions, such as sweat, saliva, sputum, nasal mucus, vomit, urine, or feces, on them. Wear gloves when handling laundry from the patient. Read and follow directions on labels of laundry or clothing items and detergent. In general, wash and dry with the warmest temperatures recommended on the label.  Clean all areas the individual has used often Clean all touchable surfaces, such as counters, tabletops, doorknobs, bathroom fixtures, toilets, phones, keyboards, tablets, and bedside tables, every day. Also, clean any surfaces that may have blood, body fluids, and/or secretions or excretions on them. Wear gloves when cleaning surfaces the patient has come in contact with. Use a diluted bleach solution (e.g., dilute bleach with 1 part bleach and 10 parts water) or a household disinfectant with a label that says EPA-registered for coronaviruses. To make a bleach solution at home, add 1 tablespoon of bleach to 1 quart (4 cups) of water. For a larger supply, add  cup of bleach  to 1 gallon (16 cups) of water. Read labels of cleaning products and follow recommendations provided on product labels. Labels contain instructions for safe and effective use of the cleaning product including precautions you should take when applying the product, such as wearing gloves or eye protection and making sure you have good ventilation during use of the product. Remove gloves and wash hands immediately after cleaning.  Monitor yourself for signs and symptoms of illness Caregivers and household members are considered close contacts, should monitor their health, and will be asked to limit movement outside of the home to the extent possible. Follow the monitoring steps for close contacts listed on the symptom monitoring form.   If you have additional questions, contact your local health department or call the epidemiologist on call at [phone] (available 24/7). This guidance is subject to change. For the most up-to-date guidance from [facility], please refer to their website: YouBlogs.pl   You were cared for by a hospitalist during your hospital stay. If you have any questions about your discharge medications or the care you received while you were in the hospital after you are discharged, you can call the unit and asked to speak with the hospitalist on call if the hospitalist that took care of you is not available. Once you are discharged, your primary care physician will handle any further medical issues. Please note that NO REFILLS for any discharge medications will be authorized once you are discharged, as it is imperative that you return to your primary care physician (or establish a relationship with a primary care physician if you do not have one) for your aftercare needs so that they can reassess your need for medications and monitor your lab values. If you do not have a primary care physician, you can call [phone] for a  physician referral.   Increase activity slowly   Complete by: As directed         Allergies as of 11/09/2019   No Known Allergies     Medication List    TAKE these medications   albuterol 108 (90 Base) MCG/ACT inhaler Commonly known as: VENTOLIN HFA Inhale 2 puffs into the lungs every 4 (four)  hours as needed for wheezing or shortness of breath.   benzonatate 100 MG capsule Commonly known as: TESSALON Take 1 capsule (100 mg total) by mouth every 8 (eight) hours.   famotidine 20 MG tablet Commonly known as: PEPCID Take 1 tablet (20 mg total) by mouth 2 (two) times daily for 10 days.   MUCINEX FAST-MAX COLD FLU PO Take 10 mLs by mouth 2 (two) times daily as needed (cough and cold).   predniSONE 20 MG tablet Commonly known as: DELTASONE Take 3 tablets once daily for 3 days followed by 2 tablets once daily for 3 days followed by 1 tablet once daily for 3 days and then stop What changed:   medication strength  how much to take  how to take this  when to take this  additional instructions         Follow-up Information    [facility] Follow up on 12/03/2019.   Why: 1015 am they will follow up on primary care Contact information: [place] [phone] [place]: 35 minutes  [place]  [facility] Pager on [email or web address]  11/09/2019, 2:57 PM

## 2019-11-10 LAB — CULTURE, BLOOD (ROUTINE X 2)
Culture: NO GROWTH
Culture: NO GROWTH
Special Requests: ADEQUATE
Special Requests: ADEQUATE

## 2019-12-03 ENCOUNTER — Ambulatory Visit (INDEPENDENT_AMBULATORY_CARE_PROVIDER_SITE_OTHER): Payer: Self-pay | Admitting: Nurse Practitioner

## 2019-12-03 VITALS — BP 118/68 | HR 92 | Temp 97.3°F | Wt 307.0 lb

## 2019-12-03 DIAGNOSIS — J1282 Pneumonia due to coronavirus disease 2019: Secondary | ICD-10-CM

## 2019-12-03 DIAGNOSIS — U071 COVID-19: Secondary | ICD-10-CM

## 2019-12-03 DIAGNOSIS — Z8616 Personal history of COVID-19: Secondary | ICD-10-CM

## 2019-12-03 NOTE — Patient Instructions (Addendum)
Covid 19:   Stay well hydrated  Stay active  Deep breathing exercises  May start vitamin C 2,000 mg daily, vitamin D3 2,000 IU daily, Zinc 220 mg daily, and Quercetin 500 mg twice daily  May take tylenol or fever or pain  Will order chest x ray   Follow up:  Follow if needed

## 2019-12-03 NOTE — Progress Notes (Signed)
@Patient  ID: Micheal Hall, male    DOB: 03/24/1995, 24 y.o.   MRN: [id number]  Chief Complaint  Patient presents with  . Hospitalization Follow-up    COVID Pos: 8/18 Hosp: 8/31-9/4 No sxm requesting vaccine exemption letter due to him having the infusion in the hospital.    Referring provider: No ref. provider found   24 year old male with history of obesity and hypertension.  Diagnosed with Covid 10/23/2019.  HPI   Patient presents today for post COVID care clinic visit and ED follow-up.  Patient was hospitalized on 11/05/2019 through 11/09/2019 for Covid pneumonia and acute hypoxic respiratory failure.  Patient states that since he was discharged from the hospital he has been doing well.  He plans to return to work.  He does need a work note stating that he cannot get his Covid vaccine for 90 days due to the fact that he was treated with remdesivir. Denies f/c/s, n/v/d, hemoptysis, PND, chest pain or edema.       No Known Allergies   There is no immunization history on file for this patient.  Past Medical History:  Diagnosis Date  . Allergy   . Obesity     Tobacco History: Social History   Tobacco Use  Smoking Status Former Smoker  . Packs/day: 1.00  . Types: Cigarettes, E-cigarettes  . Quit date: 10/22/2019  . Years since quitting: 0.1  Smokeless Tobacco Never Used   Counseling given: Yes   Outpatient Encounter Medications as of 12/03/2019  Medication Sig  . albuterol (VENTOLIN HFA) 108 (90 Base) MCG/ACT inhaler Inhale 2 puffs into the lungs every 4 (four) hours as needed for wheezing or shortness of breath. (Patient not taking: Reported on 12/03/2019)  . benzonatate (TESSALON) 100 MG capsule Take 1 capsule (100 mg total) by mouth every 8 (eight) hours. (Patient not taking: Reported on 12/03/2019)  . famotidine (PEPCID) 20 MG tablet Take 1 tablet (20 mg total) by mouth 2 (two) times daily for 10 days.  Marland Kitchen Phenylephrine-DM-GG-APAP (MUCINEX FAST-MAX COLD FLU PO) Take 10  mLs by mouth 2 (two) times daily as needed (cough and cold).  (Patient not taking: Reported on 12/03/2019)  . [DISCONTINUED] predniSONE (DELTASONE) 20 MG tablet Take 3 tablets once daily for 3 days followed by 2 tablets once daily for 3 days followed by 1 tablet once daily for 3 days and then stop   No facility-administered encounter medications on file as of 12/03/2019.     Review of Systems  Review of Systems  Constitutional: Negative.  Negative for chills and fever.  HENT: Negative.   Respiratory: Negative for cough, shortness of breath and wheezing.   Cardiovascular: Negative.  Negative for chest pain, palpitations and leg swelling.  Gastrointestinal: Negative.   Allergic/Immunologic: Negative.   Neurological: Negative.   Psychiatric/Behavioral: Negative.        Physical Exam  BP 118/68 (BP Location: Left Arm)   Pulse 92   Temp (!) 97.3 F (36.3 C)   Wt (!) 307 lb 0.1 oz (139.3 kg)   SpO2 98%   BMI 44.05 kg/m   Wt Readings from Last 5 Encounters:  12/03/19 (!) 307 lb 0.1 oz (139.3 kg)  11/06/19 (!) 315 lb 11.2 oz (143.2 kg)  09/27/16 295 lb (133.8 kg)  09/25/16 293 lb (132.9 kg)  06/18/14 (!) 335 lb 14.4 oz (152.4 kg) (>99 %, Z= 3.38)*   * Growth percentiles are based on CDC (Boys, 2-20 Years) data.     Physical Exam Vitals  and nursing note reviewed.  Constitutional:      General: He is not in acute distress.    Appearance: He is well-developed.  Cardiovascular:     Rate and Rhythm: Normal rate and regular rhythm.  Pulmonary:     Effort: Pulmonary effort is normal.     Breath sounds: Normal breath sounds.  Skin:    General: Skin is warm and dry.  Neurological:     Mental Status: He is alert and oriented to person, place, and time.  Psychiatric:        Mood and Affect: Mood normal.        Behavior: Behavior normal.       Imaging: CT Angio Chest PE W/Cm &/Or Wo Cm  Result Date: 11/05/2019 CLINICAL DATA:  COVID pneumonia, hypoxia, dyspnea EXAM: CT  ANGIOGRAPHY CHEST WITH CONTRAST TECHNIQUE: Multidetector CT imaging of the chest was performed using the standard protocol during bolus administration of intravenous contrast. Multiplanar CT image reconstructions and MIPs were obtained to evaluate the vascular anatomy. CONTRAST:  68mL OMNIPAQUE IOHEXOL 350 MG/ML SOLN COMPARISON:  10/31/2019 FINDINGS: Cardiovascular: Satisfactory opacification of the pulmonary arteries to the segmental level. No evidence of pulmonary embolism. Normal heart size. No pericardial effusion. Mediastinum/Nodes: No enlarged mediastinal, hilar, or axillary lymph nodes. Thyroid gland, trachea, and esophagus demonstrate no significant findings. Lungs/Pleura: There has been significant interval progression of widespread bilateral ground-glass pulmonary infiltrates and progressive bibasilar pulmonary consolidation in keeping with progressive pneumonic infiltrate. No pneumothorax or pleural effusion. Central airways are widely patent. Upper Abdomen: No acute abnormality. Musculoskeletal: No chest wall abnormality. No acute or significant osseous findings. Review of the MIP images confirms the above findings. IMPRESSION: 1. No evidence of pulmonary embolus. 2. Significant interval progression of widespread bilateral ground-glass pulmonary infiltrates and progressive bibasilar pulmonary consolidation in keeping with progressive pneumonic infiltrate. Electronically Signed   By: Fidela Salisbury MD   On: 11/05/2019 16:23   DG Chest Port 1 View  Result Date: 11/05/2019 CLINICAL DATA:  Respiratory distress, COVID EXAM: PORTABLE CHEST 1 VIEW COMPARISON:  10/31/2019 FINDINGS: The heart size and mediastinal contours are within normal limits. New diffuse bilateral heterogeneous airspace opacity. The visualized skeletal structures are unremarkable. IMPRESSION: New diffuse bilateral heterogeneous airspace opacity, consistent with COVID airspace disease. Electronically Signed   By: Eddie Candle M.D.   On:  11/05/2019 11:08     Assessment & Plan:   Pneumonia due to COVID-19 virus Stay well hydrated  Stay active  Deep breathing exercises  May start vitamin C 2,000 mg daily, vitamin D3 2,000 IU daily, Zinc 220 mg daily, and Quercetin 500 mg twice daily  May take tylenol or fever or pain  Will order chest x ray   Follow up:  Follow if needed      Fenton Foy, NP 12/03/2019

## 2019-12-03 NOTE — Assessment & Plan Note (Signed)
Stay well hydrated  Stay active  Deep breathing exercises  May start vitamin C 2,000 mg daily, vitamin D3 2,000 IU daily, Zinc 220 mg daily, and Quercetin 500 mg twice daily  May take tylenol or fever or pain  Will order chest x ray   Follow up:  Follow if needed

## 2021-01-11 ENCOUNTER — Ambulatory Visit (HOSPITAL_COMMUNITY)
Admission: EM | Admit: 2021-01-11 | Discharge: 2021-01-11 | Disposition: A | Discharge status: Home / Self Care | Payer: BC Managed Care – PPO | Attending: Emergency Medicine | Admitting: Emergency Medicine

## 2021-01-11 ENCOUNTER — Encounter (HOSPITAL_COMMUNITY): Payer: Self-pay

## 2021-01-11 DIAGNOSIS — Z113 Encounter for screening for infections with a predominantly sexual mode of transmission: Secondary | ICD-10-CM | POA: Insufficient documentation

## 2021-01-11 DIAGNOSIS — R030 Elevated blood-pressure reading, without diagnosis of hypertension: Secondary | ICD-10-CM | POA: Insufficient documentation

## 2021-01-11 NOTE — ED Triage Notes (Signed)
Pt states that he wants labs to see if he has an STI and to see if he is diabetic. Pt denies any exposure to anyone with an STI and has no sx.

## 2021-01-11 NOTE — Discharge Instructions (Addendum)
No intercourse until all of your labs have been resulted.  We will contact you if they come back positive for anything requiring treatment.  Decrease your salt intake. diet and exercise will lower your blood pressure significantly. It is important to keep your blood pressure under good control, as having a elevated blood pressure for prolonged periods of time significantly increases your risk of stroke, heart attacks, kidney damage, eye damage, and other problems. Measure your blood pressure once a day, preferably at the same time every day. Keep a log of this and bring it to your next doctor's appointment.  Bring your blood pressure cuff as well.  Return here in 2 weeks for blood pressure recheck if you're unable to find a primary care physician by then. Return immediately to the ER if you start having chest pain, headache, problems seeing, problems talking, problems walking, if you feel like you're about to pass out, if you do pass out, if you have a seizure, or for any other concerns.  Below is a list of primary care practices who are taking new patients for you to follow-up with.  [facility] internal medicine clinic Ground Floor - [facility], [place], [place], [place] [address] [phone]  [facility] at [facility] [address] [place], [place] [address] [phone]  [facility] [place], [place] [address] [phone]  [facility] [phone] [place] [address]  [facility]: [facility] [place]  [phone]  [facility]: [address] [place]  [phone]  [place] primary care : [address] [place] [phone]  [facility]: [address] [place]  [phone]  [facility]: [place] [phone]  Dr. Blanchie Serve [place] [address]  [phone]  Go to www.goodrx.com  or www.costplusdrugs.com to look up your medications. This will give you a list of where you can find your prescriptions at the most affordable prices. Or ask the pharmacist what the cash price is, or if they have any other discount programs available to help make your medication more affordable. This can be less expensive than what you would pay with insurance.

## 2021-01-11 NOTE — ED Provider Notes (Signed)
HPI  SUBJECTIVE:  Micheal Hall is a 25 y.o. male who presents for STD screening.  He is in a new sexual relationship with a male and also has another long-term male sexual partner.  He uses condoms inconsistently with them.  They are both asymptomatic to his knowledge.  He denies abdominal, back, testicular pain or swelling.  No penile rash, discharge, urinary complaints.  He has a past medical history of chlamydia.  No history of gonorrhea, HSV, HSV, syphilis, trichomonas, hypertension.  PMD: None.  Past Medical History:  Diagnosis Date   Allergy    Obesity     Past Surgical History:  Procedure Laterality Date   WISDOM TOOTH EXTRACTION      Family History  Problem Relation Age of Onset   Cancer Mother        lymphoma; in remission   Hypertension Mother    Hypertension Maternal Aunt    Cancer Maternal Aunt    Hypertension Maternal Uncle    Cancer Maternal Uncle    Cancer Maternal Grandmother    Hypertension Maternal Grandmother    Hypertension Maternal Grandfather    Thyroid disease Maternal Aunt    Hypertension Father    Heart disease Father     Social History   Tobacco Use   Smoking status: Former    Types: E-cigarettes   Smokeless tobacco: Never  Scientific laboratory technician Use: Every day  Substance Use Topics   Alcohol use: Yes    Comment: 1 time a month   Drug use: No    No current facility-administered medications for this encounter. No current outpatient medications on file.  No Known Allergies   ROS  As noted in HPI.   Physical Exam  BP (!) 169/113 (BP Location: Left Arm)   Pulse 82   Temp 98.8 F (37.1 C) (Oral)   Resp 14   SpO2 97%   Constitutional: Well developed, well nourished, no acute distress Eyes:  EOMI, conjunctiva normal bilaterally HENT: Normocephalic, atraumatic,mucus membranes moist Respiratory: Normal inspiratory effort Cardiovascular: Normal rate GI: nondistended GU: Normal circumcised male, testes descended bilaterally.   No penile rash, discharge. skin: No rash, skin intact Musculoskeletal: no deformities Neurologic: Alert & oriented x 3, no focal neuro deficits Psychiatric: Speech and behavior appropriate   ED Course   Medications - No data to display  No orders of the defined types were placed in this encounter.   No results found for this or any previous visit (from the past 24 hour(s)). No results found.  ED Clinical Impression  1. Screening for STDs (sexually transmitted diseases)   2. Elevated blood pressure reading      ED Assessment/Plan  1.  STD testing.  Patient declined HIV, syphilis testing.  Sending off swab for gonorrhea, chlamydia, trichomonas.  will base further treatment off labs.  2.  Diabetes testing.  Patient decided to defer this and continue diet and exercise.  3.  Elevated blood pressure reading. Pt has no historical evidence of end organ damage. Pt denies any CNS type sx such as HA, visual changes, focal paresis, or new onset seizure activity. Pt denies any CV sx such as CP, dyspnea, palpitations, pedal edema, tearing pain radiating to back or abd. Pt denied any renal sx such as anuria or hematuria. s. Discussed importance of lifestyle modifications as important first steps, and the importance of taking usual BP medications. Pt to f/u as OP.  ER return precautions given.  Will order assistance in finding  a PMD and give patient primary care list.  Discussed  MDM, treatment plan, and plan for follow-up with patient. Discussed sn/sx that should prompt return to the ED. patient agrees with plan.   No orders of the defined types were placed in this encounter.     *This clinic note was created using Dragon dictation software. Therefore, there may be occasional mistakes despite careful proofreading.  ?    Melynda Ripple, MD 01/11/21 1100

## 2021-01-12 LAB — CYTOLOGY, (ORAL, ANAL, URETHRAL) ANCILLARY ONLY
Chlamydia: NEGATIVE
Comment: NEGATIVE
Comment: NEGATIVE
Comment: NORMAL
Neisseria Gonorrhea: NEGATIVE
Trichomonas: NEGATIVE

## 2021-06-18 ENCOUNTER — Ambulatory Visit: Payer: BC Managed Care – PPO | Admitting: Nurse Practitioner

## 2021-07-08 NOTE — Progress Notes (Deleted)
   New Patient Office Visit  Subjective    Patient ID: Micheal Hall, male    DOB: 1995/04/17  Age: 26 y.o. MRN: [id number]  CC: No chief complaint on file.   HPI Micheal Hall presents for new patient visit to establish care.  Introduced to Designer, jewellery role and practice setting.  All questions answered.  Discussed provider/patient relationship and expectations.   No outpatient encounter medications on file as of 07/09/2021.   No facility-administered encounter medications on file as of 07/09/2021.    Past Medical History:  Diagnosis Date   Allergy    Obesity     Past Surgical History:  Procedure Laterality Date   WISDOM TOOTH EXTRACTION      Family History  Problem Relation Age of Onset   Cancer Mother        lymphoma; in remission   Hypertension Mother    Hypertension Maternal Aunt    Cancer Maternal Aunt    Hypertension Maternal Uncle    Cancer Maternal Uncle    Cancer Maternal Grandmother    Hypertension Maternal Grandmother    Hypertension Maternal Grandfather    Thyroid disease Maternal Aunt    Hypertension Father    Heart disease Father     Social History   Socioeconomic History   Marital status: Single    Spouse name: Not on file   Number of children: Not on file   Years of education: Not on file   Highest education level: Not on file  Occupational History   Not on file  Tobacco Use   Smoking status: Former    Types: E-cigarettes   Smokeless tobacco: Never  Vaping Use   Vaping Use: Every day  Substance and Sexual Activity   Alcohol use: Yes    Comment: 1 time a month   Drug use: No   Sexual activity: Yes  Other Topics Concern   Not on file  Social History Narrative   Not on file   Social Determinants of Health   Financial Resource Strain: Not on file  Food Insecurity: Not on file  Transportation Needs: Not on file  Physical Activity: Not on file  Stress: Not on file  Social Connections: Not on file  Intimate Partner Violence:  Not on file    ROS      Objective    There were no vitals taken for this visit.  Physical Exam  {Labs (Optional):23779}    Assessment & Plan:   Problem List Items Addressed This Visit   None   No follow-ups on file.   Charyl Dancer, NP

## 2021-07-09 ENCOUNTER — Telehealth: Payer: Self-pay | Admitting: Nurse Practitioner

## 2021-07-09 ENCOUNTER — Ambulatory Visit: Payer: BC Managed Care – PPO | Admitting: Nurse Practitioner

## 2021-07-09 NOTE — Telephone Encounter (Signed)
Pt was a no show for his NP app with Lauren on 07/09/21, I sent a no show letter. ?

## 2021-08-03 NOTE — Telephone Encounter (Signed)
1st no show, fee waived ?

## 2022-04-16 IMAGING — CT CT ANGIO CHEST
2 of 6 series · 18 of 46 positions shown · IV contrast (omnipaque)
Comparison: 10/31/2019

CLINICAL DATA: COVID pneumonia, hypoxia, dyspnea

EXAM:
CT ANGIOGRAPHY CHEST WITH CONTRAST
TECHNIQUE: Multidetector CT imaging of the chest was performed using the
standard protocol during bolus administration of intravenous
contrast. Multiplanar CT image reconstructions and MIPs were
obtained to evaluate the vascular anatomy.
CONTRAST:  75mL OMNIPAQUE IOHEXOL 350 MG/ML SOLN

[Series 7: thins · axial · 0.85mm/px · z∈[+1115,+1343]mm · 15 of 250 slices shown]
[im 11/250  lung]
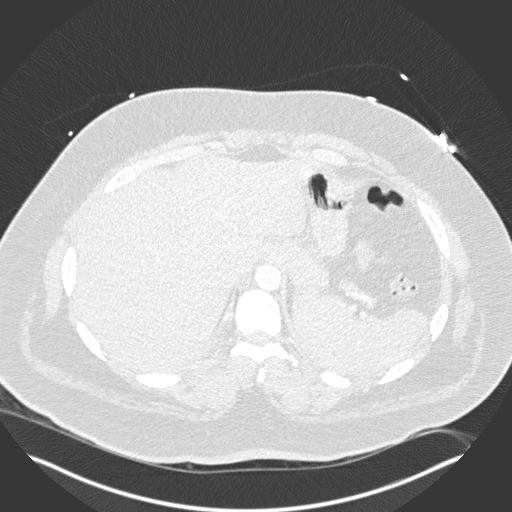
[im 33/250  soft-tissue]
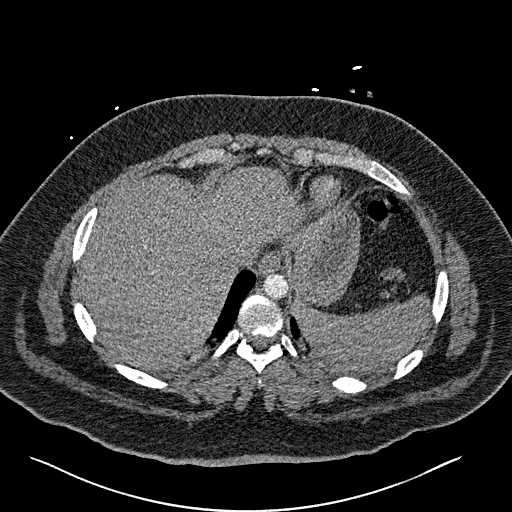
[im 44/250  lung]
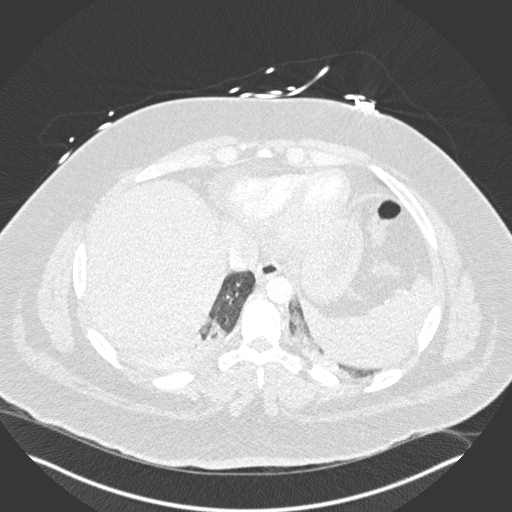
[im 65/250  soft-tissue]
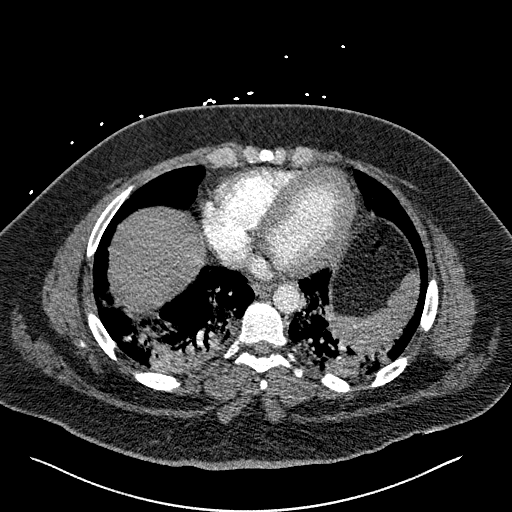
[im 76/250  lung]
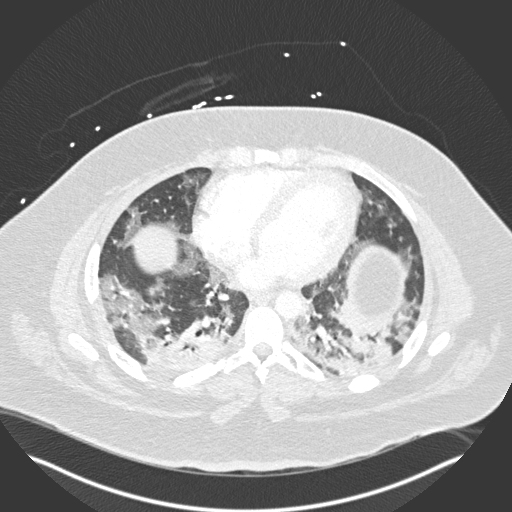
[im 98/250  soft-tissue]
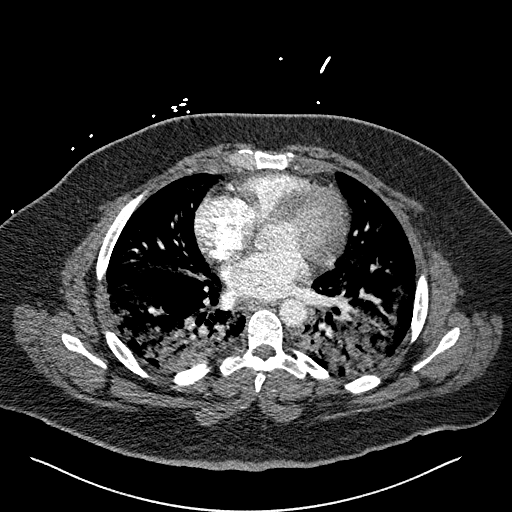
[im 109/250  lung]
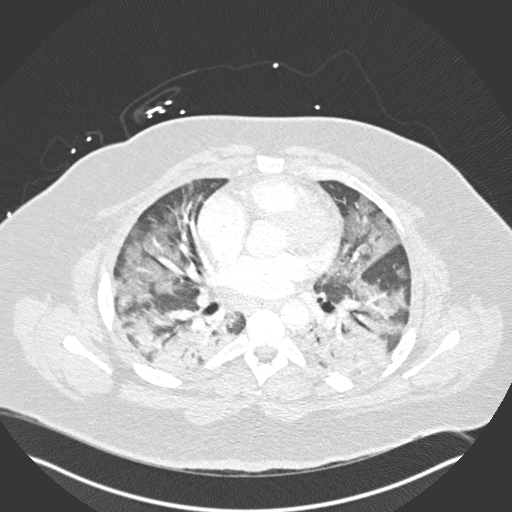
[im 130/250  soft-tissue]
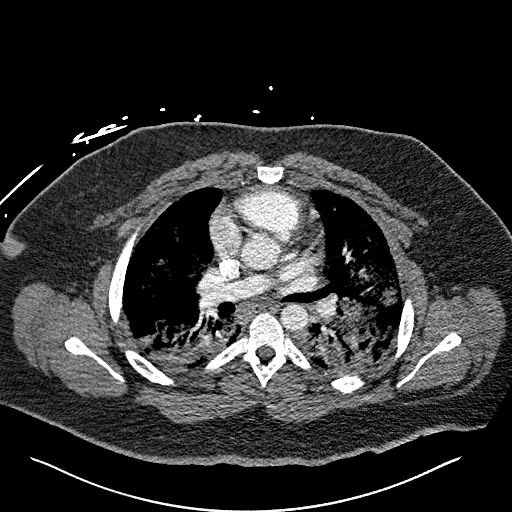
[im 141/250  lung]
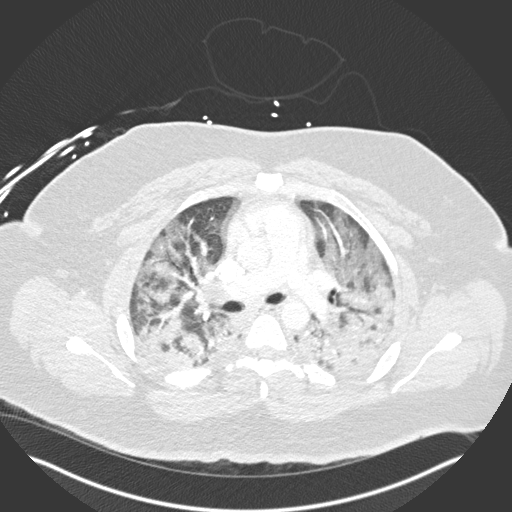
[im 152/250  soft-tissue]
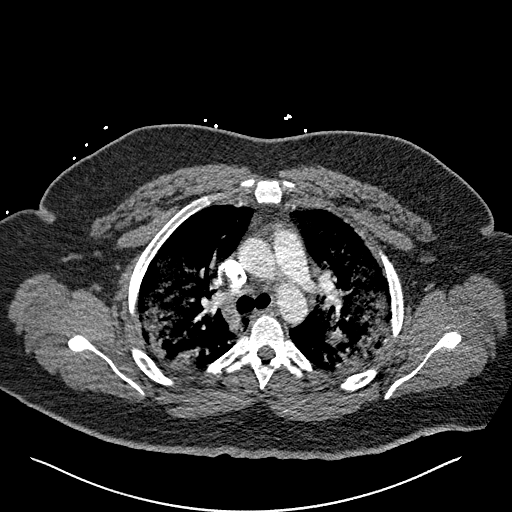
[im 174/250  lung]
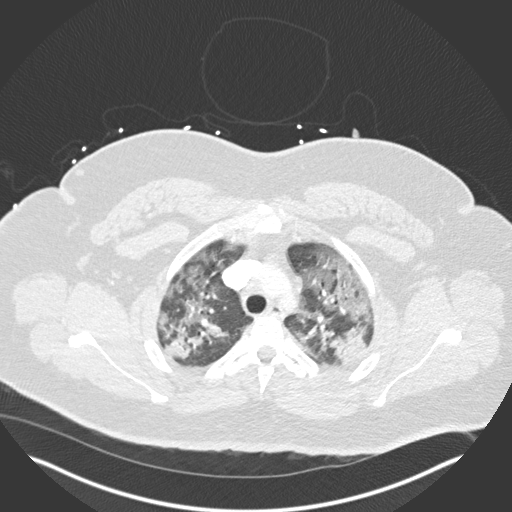
[im 185/250  soft-tissue]
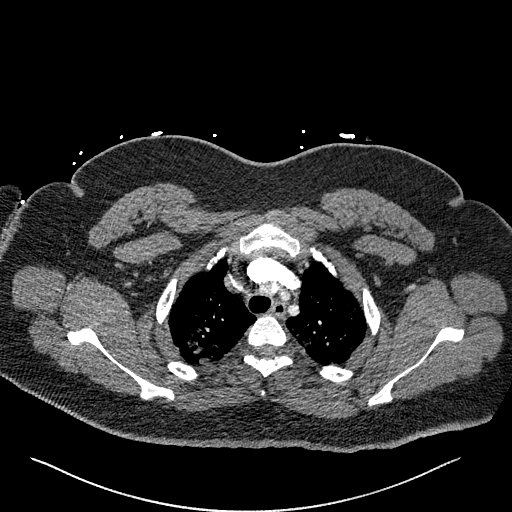
[im 206/250  lung]
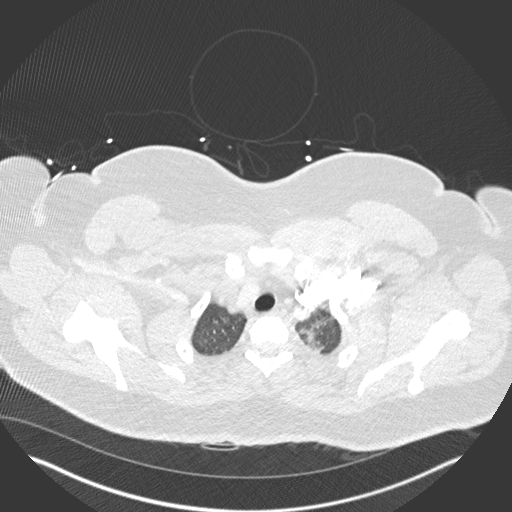
[im 217/250  soft-tissue]
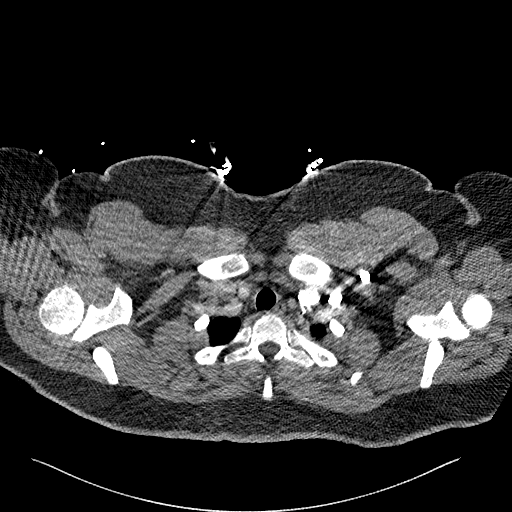
[im 239/250  lung]
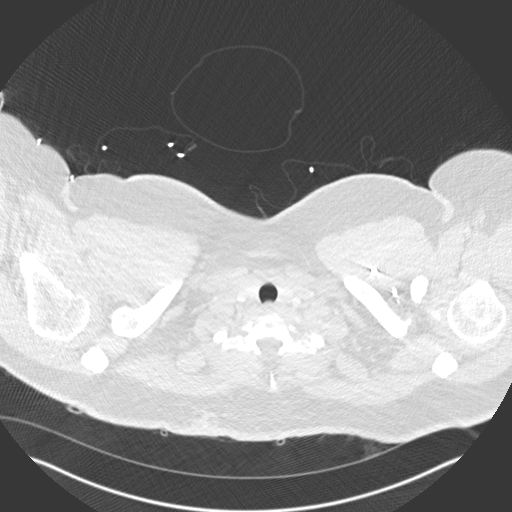

[Series 9: coronal mpr · coronal · 0.52mm/px · 3 of 143 slices shown]
[im 36/143  soft-tissue]
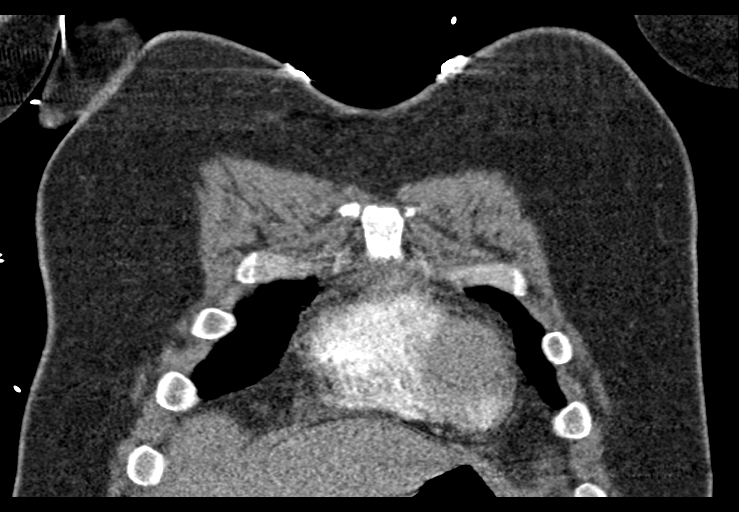
[im 72/143  soft-tissue]
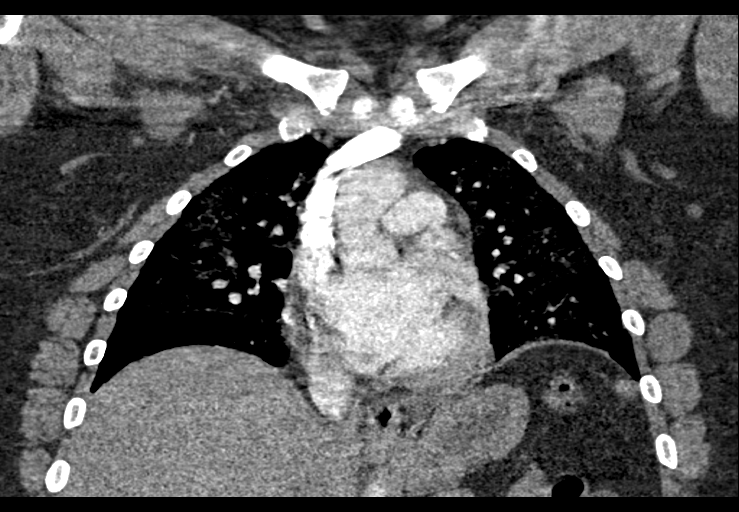
[im 107/143  soft-tissue]
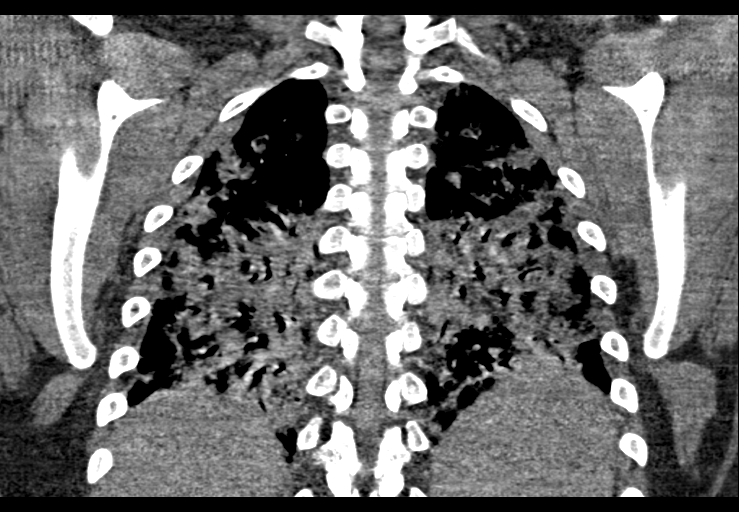

[18 of 46 positions shown; findings below may reference images not displayed]

FINDINGS: Cardiovascular: Satisfactory opacification of the pulmonary arteries
to the segmental level. No evidence of pulmonary embolism. Normal
heart size. No pericardial effusion.

Mediastinum/Nodes: No enlarged mediastinal, hilar, or axillary lymph
nodes. Thyroid gland, trachea, and esophagus demonstrate no
significant findings.

Lungs/Pleura: There has been significant interval progression of
widespread bilateral ground-glass pulmonary infiltrates and
progressive bibasilar pulmonary consolidation in keeping with
progressive pneumonic infiltrate. No pneumothorax or pleural
effusion. Central airways are widely patent.

Upper Abdomen: No acute abnormality.

Musculoskeletal: No chest wall abnormality. No acute or significant
osseous findings.

Review of the MIP images confirms the above findings.
IMPRESSION: 1. No evidence of pulmonary embolus.
2. Significant interval progression of widespread bilateral
ground-glass pulmonary infiltrates and progressive bibasilar
pulmonary consolidation in keeping with progressive pneumonic
infiltrate.

## 2022-05-02 ENCOUNTER — Ambulatory Visit
Admission: EM | Admit: 2022-05-02 | Discharge: 2022-05-02 | Disposition: A | Discharge status: Home / Self Care | Payer: No Typology Code available for payment source | Attending: Physician Assistant | Admitting: Physician Assistant

## 2022-05-02 DIAGNOSIS — I10 Essential (primary) hypertension: Secondary | ICD-10-CM

## 2022-05-02 DIAGNOSIS — R109 Unspecified abdominal pain: Secondary | ICD-10-CM

## 2022-05-02 LAB — POCT URINALYSIS DIP (MANUAL ENTRY)
Bilirubin, UA: NEGATIVE
Blood, UA: NEGATIVE
Glucose, UA: NEGATIVE mg/dL
Ketones, POC UA: NEGATIVE mg/dL
Leukocytes, UA: NEGATIVE
Nitrite, UA: NEGATIVE
Protein Ur, POC: NEGATIVE mg/dL
Spec Grav, UA: 1.02 (ref 1.010–1.025)
Urobilinogen, UA: 0.2 E.U./dL
pH, UA: 7 (ref 5.0–8.0)

## 2022-05-02 MED ORDER — HYDROCHLOROTHIAZIDE 25 MG PO TABS: 25.0000 mg | ORAL_TABLET | Freq: Every day | ORAL | 2 refills | Status: DC

## 2022-05-02 NOTE — Discharge Instructions (Signed)
Follow up with primary care for recheck.

## 2022-05-02 NOTE — ED Triage Notes (Signed)
Pt c/o stretching on fri and felt "air bubbles go into my lower abd," then said "whatever came out I pushed it back," and it has not come out since. Now feels a constant pressure.

## 2022-05-03 NOTE — ED Provider Notes (Signed)
[facility]    CSN: [id number] Arrival date & time: 05/02/22  1443      History   Chief Complaint Chief Complaint  Patient presents with   Abdominal Pain    HPI Micheal Hall is a 27 y.o. male.   Patient reports that he was stretching 3 days ago and felt a bubbling sensation in his right lower abdomen.  Patient reports feeling the sensation of something poking out.  Patient reports that he pressed on the area and what ever was poking out seem to go back in.  Patient reports that he has had some soreness in his right lower quadrant since that time.  He is concerned about hernia.  Patient has elevated blood pressure.  He states he is not taking any medications for his blood pressure.  Patient denies any fever or chills he denies any vomiting or diarrhea he has not had any constipation  The history is provided by the patient. No language interpreter was used.  Abdominal Pain Pain location:  RLQ Pain quality: aching   Pain radiates to:  Does not radiate   Past Medical History:  Diagnosis Date   Allergy    Obesity     Patient Active Problem List   Diagnosis Date Noted   Acute respiratory failure with hypoxia ([place]) 11/05/2019   Sepsis ([place]) 11/05/2019   Pneumonia due to COVID-19 virus 11/05/2019   Atypical chest pain 06/18/2014   Low back pain 06/18/2014   Bilateral lower abdominal discomfort 04/18/2014   Morbid obesity ([place]) 02/19/2014   Acanthosis nigricans 02/19/2014   High blood pressure 02/19/2014   Allergy     Past Surgical History:  Procedure Laterality Date   WISDOM TOOTH EXTRACTION         Home Medications    Prior to Admission medications   Medication Sig Start Date End Date Taking? Authorizing Provider  hydrochlorothiazide (HYDRODIURIL) 25 MG tablet Take 1 tablet (25 mg total) by mouth daily. 05/02/22  Yes Fransico Meadow, PA-C    Family History Family History  Problem Relation Age of Onset   Cancer Mother        lymphoma; in  remission   Hypertension Mother    Hypertension Maternal Aunt    Cancer Maternal Aunt    Hypertension Maternal Uncle    Cancer Maternal Uncle    Cancer Maternal Grandmother    Hypertension Maternal Grandmother    Hypertension Maternal Grandfather    Thyroid disease Maternal Aunt    Hypertension Father    Heart disease Father     Social History Social History   Tobacco Use   Smoking status: Former    Types: E-cigarettes   Smokeless tobacco: Never  Scientific laboratory technician Use: Every day  Substance Use Topics   Alcohol use: Yes    Comment: 1 time a month   Drug use: No     Allergies   Patient has no known allergies.   Review of Systems Review of Systems  Gastrointestinal:  Positive for abdominal pain.  All other systems reviewed and are negative.    Physical Exam Triage Vital Signs ED Triage Vitals  Enc Vitals Group     BP 05/02/22 1716 (!) 200/132     Pulse Rate 05/02/22 1715 66     Resp 05/02/22 1715 16     Temp 05/02/22 1715 98.5 F (36.9 C)     Temp Source 05/02/22 1715 Oral     SpO2 05/02/22 1715 97 %  Weight --      Height --      Head Circumference --      Peak Flow --      Pain Score 05/02/22 1716 0     Pain Loc --      Pain Edu? --      Excl. in [place]? --    No data found.  Updated Vital Signs BP (!) 183/99   Pulse 66   Temp 98.5 F (36.9 C) (Oral)   Resp 16   SpO2 97%   Visual Acuity Right Eye Distance:   Left Eye Distance:   Bilateral Distance:    Right Eye Near:   Left Eye Near:    Bilateral Near:     Physical Exam Vitals and nursing note reviewed.  Constitutional:      Appearance: He is well-developed.  HENT:     Head: Normocephalic.  Cardiovascular:     Rate and Rhythm: Normal rate.  Pulmonary:     Effort: Pulmonary effort is normal.  Abdominal:     General: Abdomen is flat. There is no distension.     Palpations: Abdomen is soft.     Tenderness: There is abdominal tenderness.     Hernia: No hernia is present.      Comments: No abdominal tenderness to palpation, no hernia appreciated, no defect  Musculoskeletal:        General: Normal range of motion.     Cervical back: Normal range of motion.  Skin:    General: Skin is warm.  Neurological:     General: No focal deficit present.     Mental Status: He is alert and oriented to person, place, and time.      UC Treatments / Results  Labs (all labs ordered are listed, but only abnormal results are displayed) Labs Reviewed  POCT URINALYSIS DIP (MANUAL ENTRY)    EKG   Radiology No results found.  Procedures Procedures (including critical care time)  Medications Ordered in UC Medications - No data to display  Initial Impression / Assessment and Plan / UC Course  I have reviewed the triage vital signs and the nursing notes.  Pertinent labs & imaging results that were available during my care of the patient were reviewed by me and considered in my medical decision making (see chart for details).     MDM: Patient counseled on possible hernia he does not have an evidence of hernia currently he is advised on need for follow-up if he does indeed have a hernia and need to go to the emergency department if an area protrudes that does not go down with lying down and gentle pressure.  Patient is counseled at length on his blood pressure.  I have offered and prescribed hydrochlorothiazide for him.  I discussed diet modification and exercise.  Patient reports he is reluctant to take high blood pressure medicine.  He has agreed to follow-up with primary care and we have scheduled him an appointment with primary care for recheck and further evaluation Final Clinical Impressions(s) / UC Diagnoses   Final diagnoses:  Essential hypertension     Discharge Instructions      Follow up with primary care for recheck.    ED Prescriptions     Medication Sig Dispense Auth. Provider   hydrochlorothiazide (HYDRODIURIL) 25 MG tablet Take 1 tablet (25 mg  total) by mouth daily. 30 tablet Fransico Meadow, [place]      PDMP not reviewed this encounter. An After  Visit Summary was printed and given to the patient.       Fransico Meadow, [place] 05/03/22 [phone]

## 2022-05-16 ENCOUNTER — Ambulatory Visit: Payer: No Typology Code available for payment source | Admitting: Family Medicine

## 2023-12-01 ENCOUNTER — Ambulatory Visit: Payer: Self-pay | Admitting: Family Medicine

## 2023-12-01 ENCOUNTER — Ambulatory Visit (INDEPENDENT_AMBULATORY_CARE_PROVIDER_SITE_OTHER): Admitting: Family Medicine

## 2023-12-01 ENCOUNTER — Encounter: Payer: Self-pay | Admitting: Family Medicine

## 2023-12-01 VITALS — BP 124/82 | HR 92 | Temp 97.6°F | Ht 70.0 in | Wt 365.8 lb

## 2023-12-01 DIAGNOSIS — Z13220 Encounter for screening for lipoid disorders: Secondary | ICD-10-CM | POA: Diagnosis not present

## 2023-12-01 DIAGNOSIS — Z131 Encounter for screening for diabetes mellitus: Secondary | ICD-10-CM

## 2023-12-01 DIAGNOSIS — R361 Hematospermia: Secondary | ICD-10-CM | POA: Diagnosis not present

## 2023-12-01 DIAGNOSIS — L03031 Cellulitis of right toe: Secondary | ICD-10-CM | POA: Diagnosis not present

## 2023-12-01 LAB — BASIC METABOLIC PANEL WITH GFR
BUN: 10 mg/dL (ref 6–23)
CO2: 29 meq/L (ref 19–32)
Calcium: 9.5 mg/dL (ref 8.4–10.5)
Chloride: 101 meq/L (ref 96–112)
Creatinine, Ser: 0.85 mg/dL (ref 0.40–1.50)
GFR: 119.19 mL/min (ref >60.00)
Glucose, Bld: 94 mg/dL (ref 70–99)
Potassium: 3.8 meq/L (ref 3.5–5.1)
Sodium: 137 meq/L (ref 135–145)

## 2023-12-01 LAB — LIPID PANEL
Cholesterol: 157 mg/dL (ref 0–200)
HDL: 48.9 mg/dL (ref >39.00)
LDL Cholesterol: 91 mg/dL (ref 0–99)
NonHDL: 107.73
Total CHOL/HDL Ratio: 3
Triglycerides: 82 mg/dL (ref 0.0–149.0)
VLDL: 16.4 mg/dL (ref 0.0–40.0)

## 2023-12-01 LAB — HEMOGLOBIN A1C: Hgb A1c MFr Bld: 6.2 % (ref 4.6–6.5)

## 2023-12-01 MED ORDER — CEPHALEXIN 500 MG PO CAPS: 500.0000 mg | ORAL_CAPSULE | Freq: Four times a day (QID) | ORAL | 0 refills | Status: DC

## 2023-12-01 NOTE — Progress Notes (Signed)
 [facility]ARE [facility] [address] [place] [address] Dept: [phone] Dept Fax: [phone]  New Patient Office Visit  Subjective:    Patient ID: Micheal Hall, male    DOB: 10-Dec-1995, 28 y.o..   MRN: [id number]  Chief Complaint  Patient presents with   Establish Care    NP- establish care.   C/o low O2 levels &  blood in semen.    History of Present Illness:  Patient is in today to establish care. [name] was born in [place]. He works doing Human resources officer for [employer]. He is single and has no children. He admits to vaping nicotine. He drinks alcohol 2-3 times a month. He denies alcohol use.  [name] notes an episode of bright red blood in his semen. This occurred about 1 month ago. He was masturbating at the time. He noted a pain in his testicle. The testicular pain has lessened with time. He notes his semen still has a brownish quality. He has +/- mild discomfort with urination. He denies any straining with urination. He notes he did have chlamydia in the past.  [name] notes a recent issue with his right 2nd toe. He thinks he may have stubbed this. It has been tender.  [name] notes he has a chronic issue with focus, going back to his adolescence.  Past Medical History: Patient Active Problem List   Diagnosis Date Noted   Acute respiratory failure with hypoxia (HCC) 11/05/2019   Sepsis (HCC) 11/05/2019   Pneumonia due to COVID-19 virus 11/05/2019   Atypical chest pain 06/18/2014   Low back pain 06/18/2014   Bilateral lower abdominal discomfort 04/18/2014   Morbid obesity (HCC) 02/19/2014   Acanthosis nigricans 02/19/2014   High blood pressure 02/19/2014   Allergy    Past Surgical History:  Procedure Laterality Date   WISDOM TOOTH EXTRACTION     Family History  Problem Relation Age of Onset   Cancer Mother        lymphoma; in remission   Hypertension Mother    Hypertension Maternal Aunt     Cancer Maternal Aunt    Hypertension Maternal Uncle    Cancer Maternal Uncle    Cancer Maternal Grandmother    Hypertension Maternal Grandmother    Hypertension Maternal Grandfather    Thyroid disease Maternal Aunt    Hypertension Father    Heart disease Father    Outpatient Medications Prior to Visit  Medication Sig Dispense Refill   hydrochlorothiazide  (HYDRODIURIL ) 25 MG tablet Take 1 tablet (25 mg total) by mouth daily. 30 tablet 2   No facility-administered medications prior to visit.   No Known Allergies Objective:   Today's Vitals   12/01/23 1012  BP: 124/82  Pulse: 92  Temp: 97.6 F (36.4 C)  TempSrc: Temporal  SpO2: 95%  Weight: (!) 365 lb 12.8 oz (165.9 kg)  Height: 5' 10 (1.778 m)   Body mass index is 52.49 kg/m.   General: Well developed, well nourished. No acute distress. GU: Normal circumcised male. No rash or lesions on the penis or glans. No urethral discharge.  The testicles are   of normal size and consistency. Non-tender. Foot: The right 2nd toe has an area of paronychial inflammation with an obvious pocket of pus along the lateral   border. Psych: Alert and oriented. Normal mood and affect.  Health Maintenance Due  Topic Date Due   Hepatitis C Screening  Never done   DTaP/Tdap/Td (8 - Td  or Tdap) 11/27/2016   PROCEDURE- I & D Abscess Indication: Paronychial infection, right 2nd toe Anesthesia: Local, None  PARQ reviewed with patient. Verbal consent obtained. Site cleaned with alcohol wipe. Abscess was lanced at a shallow angle with a #11 blade. Pus was expressed fromt he wound. Sterile bandage applied. Patient tolerated procedure well. Routine wound care instructions reviewed with patient.  Assessment & Plan:   Problem List Items Addressed This Visit   None Visit Diagnoses       Hematospermia    -  Primary   I will check a UA and STD screening for potential causes.   Relevant Orders   Urinalysis w microscopic + reflex cultur    Chlamydia/GC NAA, Confirmation   HCV Ab w Reflex to Quant PCR   HIV Antibody (routine testing w rflx)   RPR     Paronychia of second toe of right foot       Abscess drained as noted. Reviewed wound care. I will place him on a course of cephaelxin.   Relevant Medications   cephALEXin  (KEFLEX ) 500 MG capsule     Screening for lipid disorders       Relevant Orders   Lipid panel     Screening for diabetes mellitus (DM)       Relevant Orders   Hemoglobin A1c   Basic metabolic panel with GFR       Return in about 4 weeks (around 12/29/2023), or evaluate attention issue.SABRA   [name] Simpler, MD

## 2023-12-02 LAB — HCV INTERPRETATION

## 2023-12-02 LAB — HCV AB W REFLEX TO QUANT PCR: HCV Ab: NONREACTIVE

## 2023-12-04 LAB — URINALYSIS W MICROSCOPIC + REFLEX CULTURE
Bacteria, UA: NONE SEEN /HPF
Bilirubin Urine: NEGATIVE
Glucose, UA: NEGATIVE
Hgb urine dipstick: NEGATIVE
Hyaline Cast: NONE SEEN /LPF
Ketones, ur: NEGATIVE
Leukocyte Esterase: NEGATIVE
Nitrites, Initial: NEGATIVE
Protein, ur: NEGATIVE
RBC / HPF: NONE SEEN /HPF (ref 0–2)
Specific Gravity, Urine: 1.013 (ref 1.001–1.035)
Squamous Epithelial / HPF: NONE SEEN /HPF (ref <=5)
WBC, UA: NONE SEEN /HPF (ref 0–5)
pH: 7.5 (ref 5.0–8.0)

## 2023-12-04 LAB — HIV ANTIBODY (ROUTINE TESTING W REFLEX)
HIV 1&2 Ab, 4th Generation: NONREACTIVE
HIV FINAL INTERPRETATION: NEGATIVE

## 2023-12-04 LAB — RPR: RPR Ser Ql: NONREACTIVE

## 2023-12-04 LAB — NO CULTURE INDICATED

## 2023-12-05 LAB — CHLAMYDIA/GC NAA, CONFIRMATION
Chlamydia trachomatis, NAA: NEGATIVE
Neisseria gonorrhoeae, NAA: NEGATIVE

## 2024-01-03 ENCOUNTER — Ambulatory Visit (INDEPENDENT_AMBULATORY_CARE_PROVIDER_SITE_OTHER): Admitting: Family Medicine

## 2024-01-03 ENCOUNTER — Encounter: Payer: Self-pay | Admitting: Family Medicine

## 2024-01-03 VITALS — BP 124/76 | HR 90 | Temp 98.7°F | Ht 70.0 in | Wt 368.2 lb

## 2024-01-03 DIAGNOSIS — R6889 Other general symptoms and signs: Secondary | ICD-10-CM | POA: Diagnosis not present

## 2024-01-03 NOTE — Progress Notes (Signed)
 [facility]ARE [facility] [address] [place] [address] Dept: [phone] Dept Fax: [phone]  Office Visit  Subjective:    Patient ID: Micheal Hall, male    DOB: 1996-03-03, 28 y.o..   MRN: [id number]  Chief Complaint  Patient presents with   Follow-up    4 week f/u  no concerns.  Declines Tdap.    History of Present Illness:  Patient is in today for an evaluation of issues with focus. At his first visit, Mr. Cart noted this had been an issue since his adolescence. He notes that in school, he often struggled with reading and language arts classes, yet excelled at math and science. He notes his father was illiterate and that this seemed to run int he family. However, as an adult, he has started reading book and enjoys this. he finds that when he has tasks that are fast moving, he cans tay focused. However, at a more leisurely pace, he tends to have distractibility. He is not finding that any of this has a significant negative impact on his day-to-day function at his job. He can force himself to focus at times on meeting productivity goals.  Past Medical History: Patient Active Problem List   Diagnosis Date Noted   Episodes of decreased attentiveness 01/03/2024   Morbid obesity with BMI of 50.0-59.9, adult (HCC) 02/19/2014   Acanthosis nigricans 02/19/2014   Allergy    Past Surgical History:  Procedure Laterality Date   WISDOM TOOTH EXTRACTION     Family History  Problem Relation Age of Onset   Cancer Mother        lymphoma; in remission   Hypertension Mother    Hypertension Father    Heart disease Father    Hypertension Maternal Aunt    Cancer Maternal Aunt        Lymphoma   Cancer Maternal Aunt        Lymphoma   Thyroid disease Maternal Aunt    Heart disease Maternal Uncle    Hypertension Maternal Uncle    Cancer Maternal Uncle        Lymphoma   Cancer Maternal Grandmother        Lymphoma   Hypertension Maternal  Grandmother    Hypertension Maternal Grandfather    Outpatient Medications Prior to Visit  Medication Sig Dispense Refill   cephALEXin  (KEFLEX ) 500 MG capsule Take 1 capsule (500 mg total) by mouth 4 (four) times daily. 28 capsule 0   No facility-administered medications prior to visit.   No Known Allergies   Objective:   Today's Vitals   01/03/24 1032  BP: 124/76  Pulse: 90  Temp: 98.7 F (37.1 C)  TempSrc: Temporal  SpO2: 96%  Weight: (!) 368 lb 3.2 oz (167 kg)  Height: 5' 10 (1.778 m)   Body mass index is 52.83 kg/m.   General: Well developed, well nourished. No acute distress. Psych: Alert and oriented. Mild psychomotor agitation noted. Mood and affect are nromal.  Health Maintenance Due  Topic Date Due   DTaP/Tdap/Td (8 - Td or Tdap) 11/27/2016   Adult ADHD Self Report Scale (most recent)     Adult ADHD Self-Report Scale (ASRS-v1.1) Symptom Checklist - 01/03/24 1104       Part A   1. How often do you have trouble wrapping up the final details of a project, once the challenging parts have been done? Rarely  2. How often do you have difficulty getting things done in order  when you have to do a task that requires organization? Often    3. How often do you have problems remembering appointments or obligations? Often  4. When you have a task that requires a lot of thought, how often do you avoid or delay getting started? Very Often    5. How often do you fidget or squirm with your hands or feet when you have to sit down for a long time? Very Often  6. How often do you feel overly active and compelled to do things, like you were driven by a motor? Sometimes      Part B   7. How often do you make careless mistakes when you have to work on a boring or difficult project? Often  8. How often do you have difficulty keeping your attention when you are doing boring or repetitive work? Very Often    9. How often do you have difficulty concentrating on what people say to you, even  when they are speaking to you directly? Often  10. How often do you misplace or have difficulty finding things at home or at work? Sometimes    11. How often are you distracted by activity or noise around you? Often  12. How often do you leave your seat in meetings or other situations in which you are expected to remain seated? Sometimes    13. How often do you feel restless or fidgety? Sometimes  14. How often do you have difficulty unwinding and relaxing when you have time to yourself? Rarely    15. How often do you find yourself talking too much when you are in social situations? Sometimes  16. When you are in a conversation, how often do you find yourself finishing the sentences of the people you are talking to, before they can finish them themselves? Often    17. How often do you have difficulty waiting your turn in situations when turn taking is required? Never  18. How often do you interrupt others when they are busy? Sometimes             Assessment & Plan:   Problem List Items Addressed This Visit       Other   Episodes of decreased attentiveness - Primary   The ASRS is suggestive of ADHD, based on his responses. However, it is unclear how prevalent this may have been in his school years, and that he is having a significant impact at present. We discussed that he may have some inattentiveness tendencies, but has employed life skills to help him manage these. At the present time, I do not clearly feel that medication management would be necessary. I recommend we monitor this for now.       Return in about 1 year (around 01/02/2025) for Annual preventative care.   [name] Simpler, MD

## 2024-01-03 NOTE — Assessment & Plan Note (Signed)
 The ASRS is suggestive of ADHD, based on his responses. However, it is unclear how prevalent this may have been in his school years, and that he is having a significant impact at present. We discussed that he may have some inattentiveness tendencies, but has employed life skills to help him manage these. At the present time, I do not clearly feel that medication management would be necessary. I recommend we monitor this for now.
